# Patient Record
Sex: Male | Born: 1945 | Race: Black or African American | Hispanic: No | Marital: Single
Health system: Southern US, Community
[De-identification: ages and names within clinical notes are randomized; demographics above are authoritative.]

## PROBLEM LIST (undated history)

## (undated) DIAGNOSIS — J449 Chronic obstructive pulmonary disease, unspecified: Secondary | ICD-10-CM

## (undated) DIAGNOSIS — E039 Hypothyroidism, unspecified: Secondary | ICD-10-CM

---

## 2002-08-01 ENCOUNTER — Encounter: Payer: Self-pay | Admitting: General Surgery

## 2002-08-01 ENCOUNTER — Inpatient Hospital Stay (HOSPITAL_COMMUNITY): Admission: AC | Admit: 2002-08-01 | Discharge: 2002-08-19 | Payer: Self-pay

## 2002-08-01 ENCOUNTER — Encounter: Payer: Self-pay | Admitting: Emergency Medicine

## 2002-08-02 ENCOUNTER — Encounter: Payer: Self-pay | Admitting: General Surgery

## 2002-08-05 ENCOUNTER — Encounter: Payer: Self-pay | Admitting: General Surgery

## 2002-08-06 ENCOUNTER — Encounter: Payer: Self-pay | Admitting: Neurosurgery

## 2002-08-07 ENCOUNTER — Encounter (INDEPENDENT_AMBULATORY_CARE_PROVIDER_SITE_OTHER): Payer: Self-pay | Admitting: Cardiology

## 2002-08-07 ENCOUNTER — Encounter: Payer: Self-pay | Admitting: Neurosurgery

## 2002-08-08 ENCOUNTER — Encounter: Payer: Self-pay | Admitting: General Surgery

## 2002-08-09 ENCOUNTER — Encounter: Payer: Self-pay | Admitting: Neurosurgery

## 2002-08-12 ENCOUNTER — Encounter: Payer: Self-pay | Admitting: Orthopedic Surgery

## 2006-10-31 ENCOUNTER — Ambulatory Visit: Payer: Self-pay | Admitting: Cardiovascular Disease

## 2006-10-31 ENCOUNTER — Inpatient Hospital Stay (HOSPITAL_COMMUNITY): Admission: EM | Admit: 2006-10-31 | Discharge: 2006-11-03 | Payer: Self-pay | Admitting: Emergency Medicine

## 2006-11-01 ENCOUNTER — Encounter: Payer: Self-pay | Admitting: Cardiovascular Disease

## 2007-09-02 ENCOUNTER — Inpatient Hospital Stay (HOSPITAL_COMMUNITY): Admission: EM | Admit: 2007-09-02 | Discharge: 2007-09-05 | Payer: Self-pay | Admitting: Emergency Medicine

## 2007-09-04 ENCOUNTER — Encounter (INDEPENDENT_AMBULATORY_CARE_PROVIDER_SITE_OTHER): Payer: Self-pay | Admitting: Internal Medicine

## 2007-12-30 ENCOUNTER — Emergency Department (HOSPITAL_COMMUNITY): Admission: EM | Admit: 2007-12-30 | Discharge: 2007-12-30 | Payer: Self-pay | Admitting: Emergency Medicine

## 2008-01-25 ENCOUNTER — Emergency Department (HOSPITAL_COMMUNITY): Admission: EM | Admit: 2008-01-25 | Discharge: 2008-01-25 | Payer: Self-pay | Admitting: Emergency Medicine

## 2008-02-05 IMAGING — CT CT HEAD W/O CM
1 of 2 series · 13 of 30 positions shown, 17 images · IV contrast (agent unspecified)
Comparison: none

CLINICAL DATA: Syncope. 
 HEAD CT WITHOUT CONTRAST:
TECHNIQUE: Contiguous axial images were obtained from the base of the skull through the vertex according to standard protocol without contrast.

[Series 2: brain · axial · 0.47mm/px · z∈[+153,+279]mm · 13 of 28 slices shown, 17 images]
[im 2/28  brain]
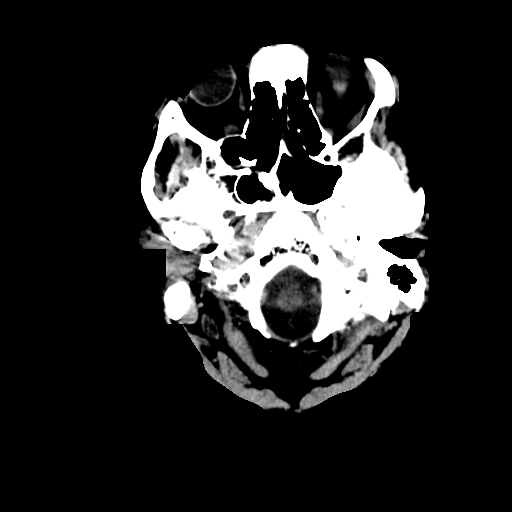
[im 2/28  bone]
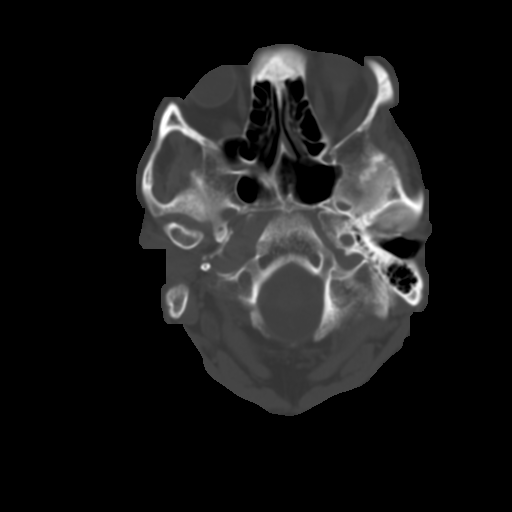
[im 4/28  brain]
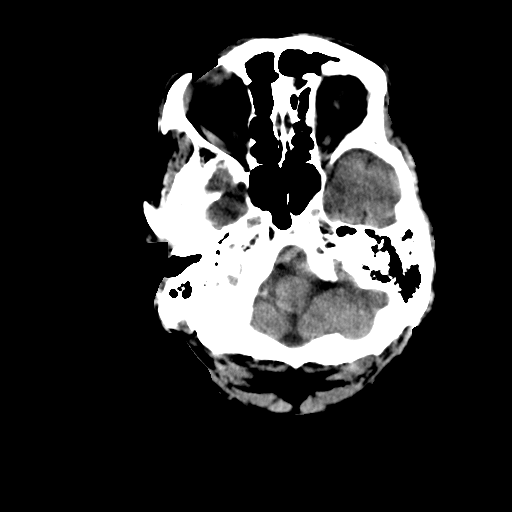
[im 6/28  brain]
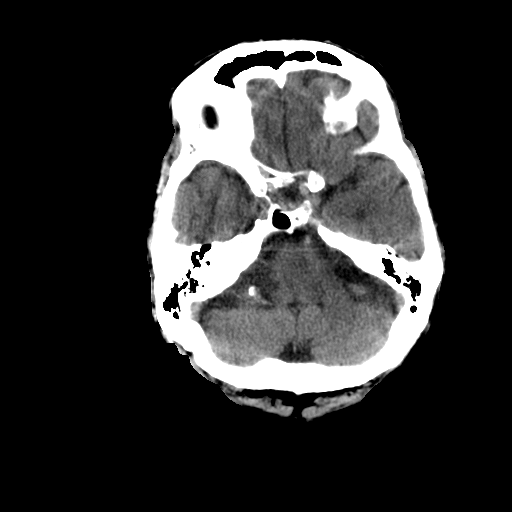
[im 8/28  brain]
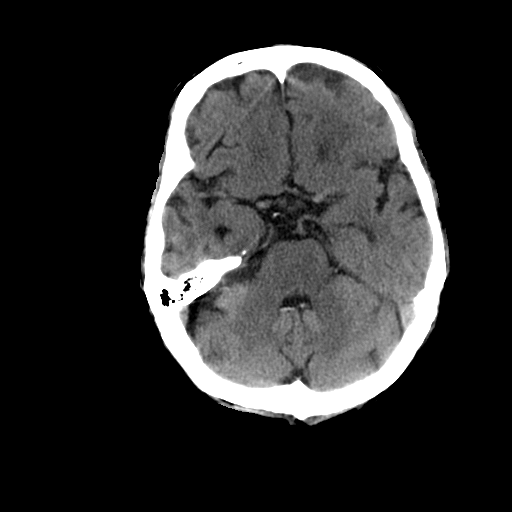
[im 10/28  brain]
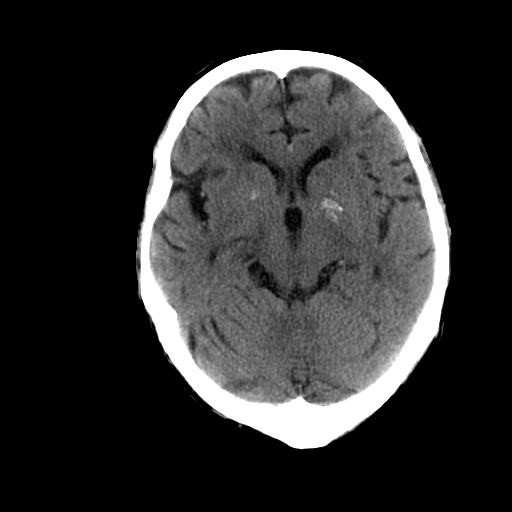
[im 10/28  bone]
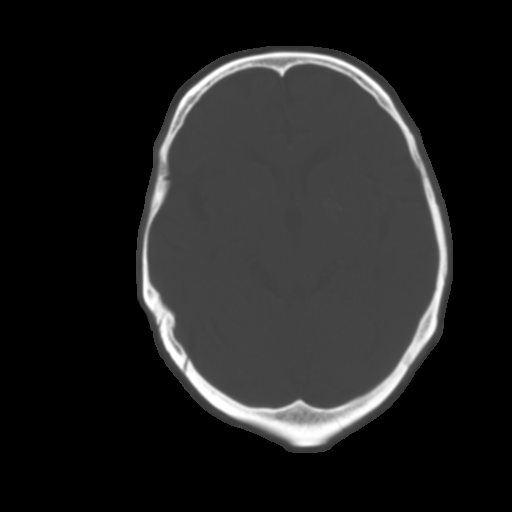
[im 12/28  brain]
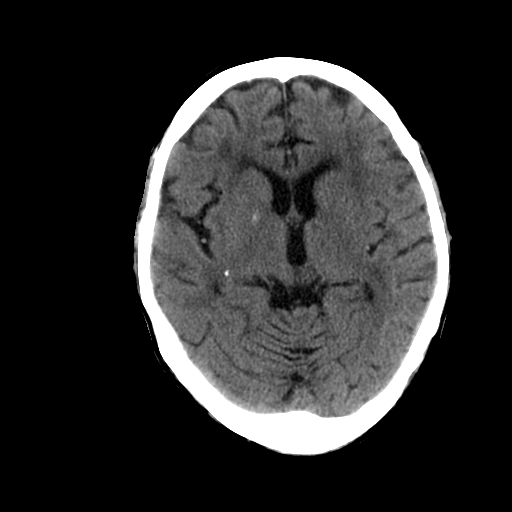
[im 14/28  brain]
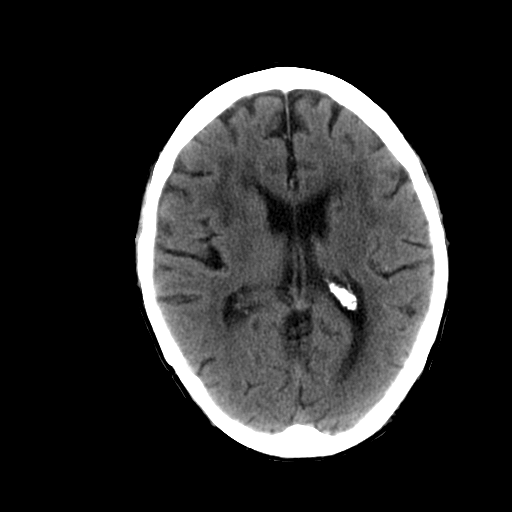
[im 16/28  brain]
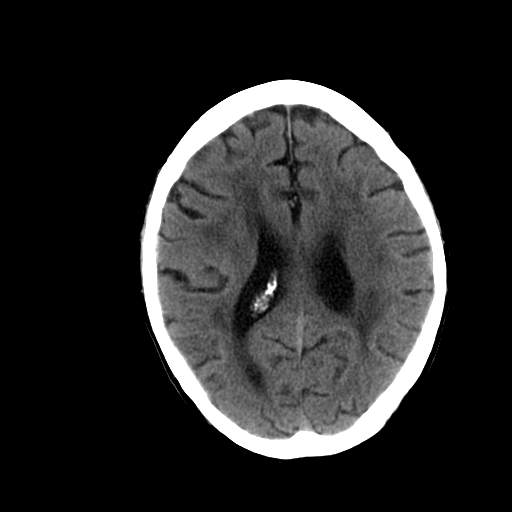
[im 18/28  brain]
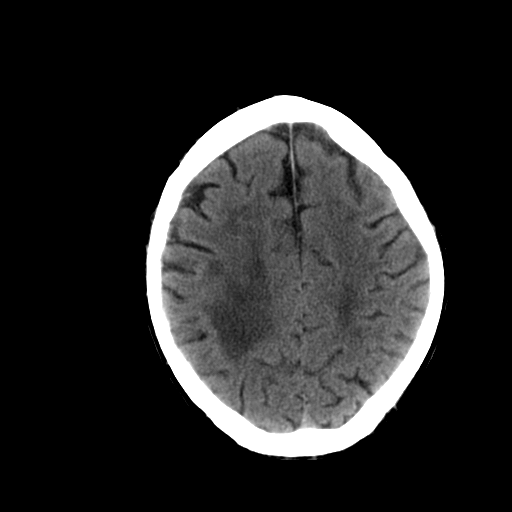
[im 18/28  bone]
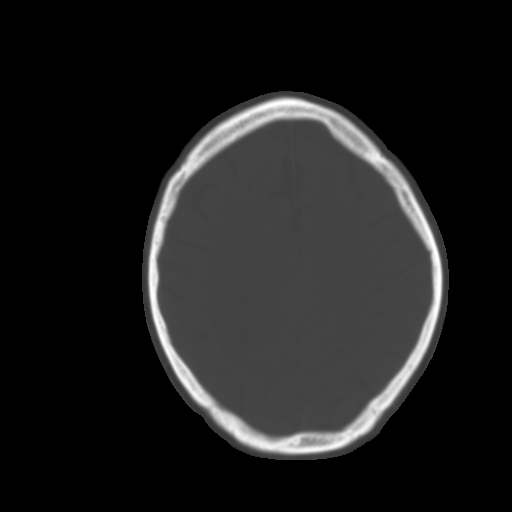
[im 20/28  brain]
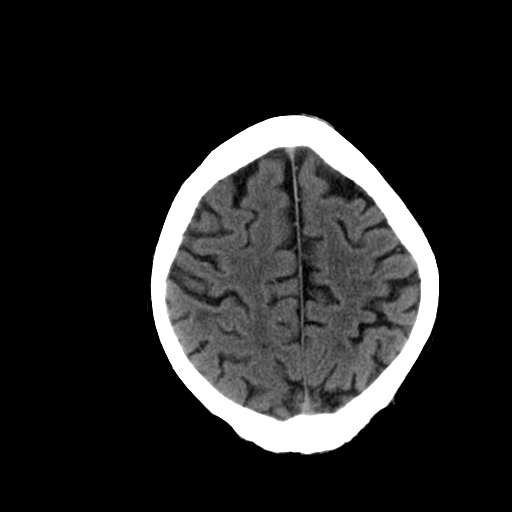
[im 22/28  brain]
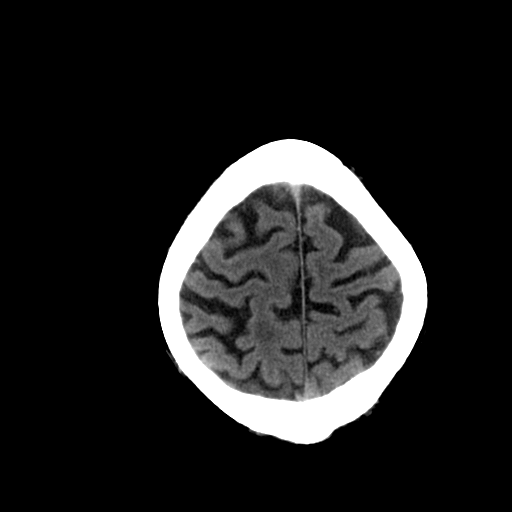
[im 24/28  brain]
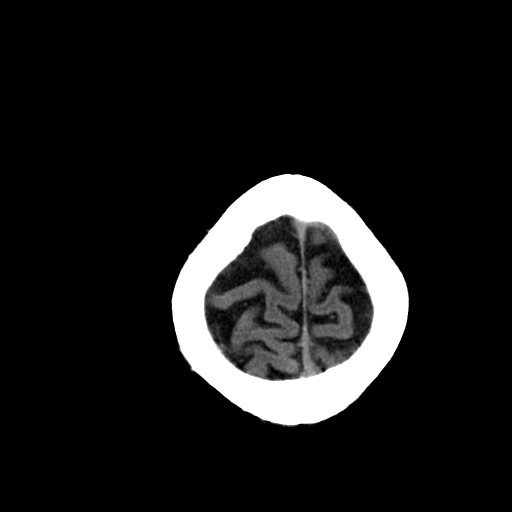
[im 26/28  brain]
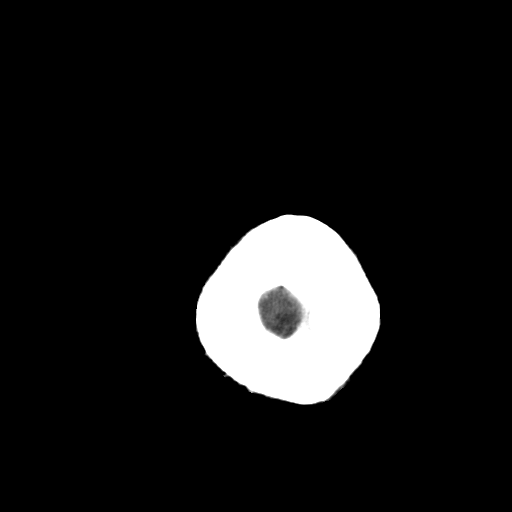
[im 26/28  bone]
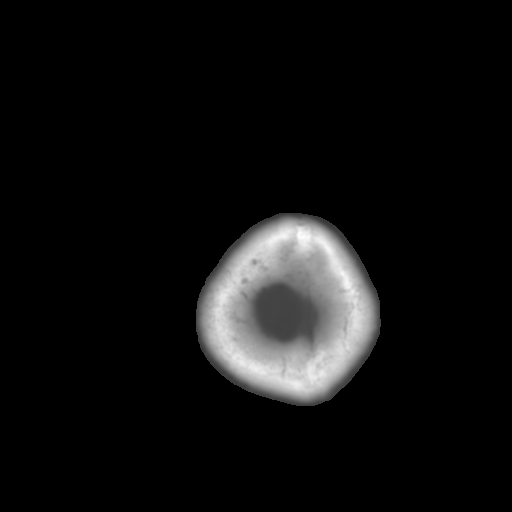

[13 of 30 positions shown; findings below may reference images not displayed]

FINDINGS: There is no evidence of intracranial hemorrhage, brain edema, or other signs of acute infarct. There is no evidence of intracranial mass or mass effect.  No extraaxial abnormalities are identified. Mild cerebral atrophy is noted. There is extensive chronic microvascular white matter disease seen. There is no evidence of hydrocephalus. No skull abnormality identified.
IMPRESSION: 1.   No acute intracranial findings. 
 2.  Cerebral atrophy and chronic microvascular white matter disease.

## 2008-04-27 ENCOUNTER — Emergency Department (HOSPITAL_COMMUNITY): Admission: EM | Admit: 2008-04-27 | Discharge: 2008-04-27 | Payer: Self-pay | Admitting: Family Medicine

## 2008-05-15 ENCOUNTER — Emergency Department (HOSPITAL_COMMUNITY): Admission: EM | Admit: 2008-05-15 | Discharge: 2008-05-16 | Payer: Self-pay | Admitting: Emergency Medicine

## 2008-06-04 ENCOUNTER — Emergency Department (HOSPITAL_COMMUNITY): Admission: EM | Admit: 2008-06-04 | Discharge: 2008-06-04 | Payer: Self-pay | Admitting: Emergency Medicine

## 2008-07-23 ENCOUNTER — Encounter: Admission: RE | Admit: 2008-07-23 | Discharge: 2008-07-23 | Payer: Self-pay | Admitting: Family Medicine

## 2009-06-05 ENCOUNTER — Ambulatory Visit: Payer: Self-pay | Admitting: Cardiovascular Disease

## 2009-06-05 ENCOUNTER — Inpatient Hospital Stay (HOSPITAL_COMMUNITY): Admission: EM | Admit: 2009-06-05 | Discharge: 2009-06-07 | Payer: Self-pay | Admitting: Emergency Medicine

## 2009-06-06 ENCOUNTER — Encounter (INDEPENDENT_AMBULATORY_CARE_PROVIDER_SITE_OTHER): Payer: Self-pay | Admitting: Emergency Medicine

## 2009-11-05 ENCOUNTER — Inpatient Hospital Stay (HOSPITAL_COMMUNITY): Admission: EM | Admit: 2009-11-05 | Discharge: 2009-12-02 | Payer: Self-pay | Admitting: Emergency Medicine

## 2009-11-05 ENCOUNTER — Ambulatory Visit: Payer: Self-pay | Admitting: Internal Medicine

## 2009-11-06 ENCOUNTER — Ambulatory Visit: Payer: Self-pay | Admitting: Vascular Surgery

## 2009-11-08 ENCOUNTER — Encounter (INDEPENDENT_AMBULATORY_CARE_PROVIDER_SITE_OTHER): Payer: Self-pay | Admitting: Pulmonary Disease

## 2009-11-16 ENCOUNTER — Ambulatory Visit: Payer: Self-pay | Admitting: Dentistry

## 2010-10-31 ENCOUNTER — Encounter: Payer: Self-pay | Admitting: Family Medicine

## 2010-12-26 LAB — HEPARIN LEVEL (UNFRACTIONATED)
Heparin Unfractionated: 0.12 IU/mL — ABNORMAL LOW (ref 0.30–0.70)
Heparin Unfractionated: 0.29 IU/mL — ABNORMAL LOW (ref 0.30–0.70)
Heparin Unfractionated: 0.64 IU/mL (ref 0.30–0.70)
Heparin Unfractionated: 0.9 IU/mL — ABNORMAL HIGH (ref 0.30–0.70)

## 2010-12-26 LAB — PHENYTOIN LEVEL, TOTAL
Phenytoin Lvl: 15.8 ug/mL (ref 10.0–20.0)
Phenytoin Lvl: 21.3 ug/mL — ABNORMAL HIGH (ref 10.0–20.0)
Phenytoin Lvl: <2.5 ug/mL — ABNORMAL LOW (ref 10.0–20.0)

## 2010-12-26 LAB — BLOOD GAS, ARTERIAL
Acid-Base Excess: 0.4 mmol/L (ref 0.0–2.0)
Acid-Base Excess: 3.9 mmol/L — ABNORMAL HIGH (ref 0.0–2.0)
Acid-Base Excess: 4.2 mmol/L — ABNORMAL HIGH (ref 0.0–2.0)
Acid-Base Excess: 4.4 mmol/L — ABNORMAL HIGH (ref 0.0–2.0)
Acid-base deficit: 2.5 mmol/L — ABNORMAL HIGH (ref 0.0–2.0)
Bicarbonate: 26.6 mEq/L — ABNORMAL HIGH (ref 20.0–24.0)
Bicarbonate: 27.3 mEq/L — ABNORMAL HIGH (ref 20.0–24.0)
Bicarbonate: 27.4 mEq/L — ABNORMAL HIGH (ref 20.0–24.0)
Bicarbonate: 27.8 mEq/L — ABNORMAL HIGH (ref 20.0–24.0)
Bicarbonate: 27.9 mEq/L — ABNORMAL HIGH (ref 20.0–24.0)
Bicarbonate: 28.3 mEq/L — ABNORMAL HIGH (ref 20.0–24.0)
Drawn by: 295031
Drawn by: 308601
FIO2: 0.3 %
FIO2: 0.4 %
FIO2: 0.4 %
FIO2: 0.4 %
MECHVT: 600 mL
MECHVT: 600 mL
O2 Saturation: 88.5 %
O2 Saturation: 95 %
O2 Saturation: 97.2 %
PEEP: 0 cmH2O
PEEP: 5 cmH2O
PEEP: 5 cmH2O
Patient temperature: 98.4
Patient temperature: 98.6
Patient temperature: 98.6
Patient temperature: 98.6
Patient temperature: 98.6
Pressure support: 10 cmH2O
RATE: 14 resp/min
RATE: 14 resp/min
RATE: 14 resp/min
TCO2: 24.6 mmol/L (ref 0–100)
TCO2: 24.8 mmol/L (ref 0–100)
TCO2: 25.5 mmol/L (ref 0–100)
TCO2: 25.8 mmol/L (ref 0–100)
TCO2: 27.5 mmol/L (ref 0–100)
pCO2 arterial: 36.7 mmHg (ref 35.0–45.0)
pCO2 arterial: 39.2 mmHg (ref 35.0–45.0)
pCO2 arterial: 39.5 mmHg (ref 35.0–45.0)
pCO2 arterial: 41.6 mmHg (ref 35.0–45.0)
pCO2 arterial: 51.7 mmHg — ABNORMAL HIGH (ref 35.0–45.0)
pCO2 arterial: 72.3 mmHg (ref 35.0–45.0)
pH, Arterial: 7.217 — ABNORMAL LOW (ref 7.350–7.450)
pH, Arterial: 7.286 — ABNORMAL LOW (ref 7.350–7.450)
pH, Arterial: 7.36 (ref 7.350–7.450)
pH, Arterial: 7.422 (ref 7.350–7.450)
pH, Arterial: 7.452 — ABNORMAL HIGH (ref 7.350–7.450)
pH, Arterial: 7.485 — ABNORMAL HIGH (ref 7.350–7.450)
pO2, Arterial: 58.7 mmHg — ABNORMAL LOW (ref 80.0–100.0)
pO2, Arterial: 62 mmHg — ABNORMAL LOW (ref 80.0–100.0)
pO2, Arterial: 71.5 mmHg — ABNORMAL LOW (ref 80.0–100.0)
pO2, Arterial: 87.5 mmHg (ref 80.0–100.0)

## 2010-12-26 LAB — BASIC METABOLIC PANEL
BUN: 20 mg/dL (ref 6–23)
BUN: 26 mg/dL — ABNORMAL HIGH (ref 6–23)
CO2: 27 mEq/L (ref 19–32)
Calcium: 6.9 mg/dL — ABNORMAL LOW (ref 8.4–10.5)
Calcium: 6.9 mg/dL — ABNORMAL LOW (ref 8.4–10.5)
Chloride: 105 mEq/L (ref 96–112)
Chloride: 108 mEq/L (ref 96–112)
Chloride: 109 mEq/L (ref 96–112)
Creatinine, Ser: 0.96 mg/dL (ref 0.4–1.5)
Creatinine, Ser: 1.77 mg/dL — ABNORMAL HIGH (ref 0.4–1.5)
GFR calc Af Amer: 47 mL/min — ABNORMAL LOW (ref >60)
GFR calc Af Amer: >60 mL/min (ref >60)
GFR calc non Af Amer: 39 mL/min — ABNORMAL LOW (ref >60)
Glucose, Bld: 101 mg/dL — ABNORMAL HIGH (ref 70–99)
Glucose, Bld: 87 mg/dL (ref 70–99)
Potassium: 3 mEq/L — ABNORMAL LOW (ref 3.5–5.1)
Potassium: 3.6 mEq/L (ref 3.5–5.1)
Sodium: 139 mEq/L (ref 135–145)

## 2010-12-26 LAB — URINE MICROSCOPIC-ADD ON

## 2010-12-26 LAB — CBC
HCT: 30.8 % — ABNORMAL LOW (ref 39.0–52.0)
HCT: 31.3 % — ABNORMAL LOW (ref 39.0–52.0)
HCT: 32.9 % — ABNORMAL LOW (ref 39.0–52.0)
HCT: 38.6 % — ABNORMAL LOW (ref 39.0–52.0)
Hemoglobin: 10.1 g/dL — ABNORMAL LOW (ref 13.0–17.0)
Hemoglobin: 10.4 g/dL — ABNORMAL LOW (ref 13.0–17.0)
Hemoglobin: 10.8 g/dL — ABNORMAL LOW (ref 13.0–17.0)
Hemoglobin: 13 g/dL (ref 13.0–17.0)
Hemoglobin: 9.9 g/dL — ABNORMAL LOW (ref 13.0–17.0)
MCHC: 31.2 g/dL (ref 30.0–36.0)
MCHC: 32.1 g/dL (ref 30.0–36.0)
MCHC: 32.2 g/dL (ref 30.0–36.0)
MCHC: 32.3 g/dL (ref 30.0–36.0)
MCHC: 32.3 g/dL (ref 30.0–36.0)
MCHC: 32.9 g/dL (ref 30.0–36.0)
MCHC: 34.1 g/dL (ref 30.0–36.0)
MCV: 100.6 fL — ABNORMAL HIGH (ref 78.0–100.0)
MCV: 99.2 fL (ref 78.0–100.0)
MCV: 99.5 fL (ref 78.0–100.0)
MCV: 99.6 fL (ref 78.0–100.0)
MCV: 99.7 fL (ref 78.0–100.0)
Platelets: 195 10*3/uL (ref 150–400)
Platelets: 200 10*3/uL (ref 150–400)
Platelets: 211 10*3/uL (ref 150–400)
Platelets: 227 10*3/uL (ref 150–400)
Platelets: 233 10*3/uL (ref 150–400)
Platelets: 268 10*3/uL (ref 150–400)
RBC: 3.1 MIL/uL — ABNORMAL LOW (ref 4.22–5.81)
RBC: 3.13 MIL/uL — ABNORMAL LOW (ref 4.22–5.81)
RBC: 3.24 MIL/uL — ABNORMAL LOW (ref 4.22–5.81)
RBC: 3.32 MIL/uL — ABNORMAL LOW (ref 4.22–5.81)
RBC: 3.41 MIL/uL — ABNORMAL LOW (ref 4.22–5.81)
RDW: 16.2 % — ABNORMAL HIGH (ref 11.5–15.5)
RDW: 16.8 % — ABNORMAL HIGH (ref 11.5–15.5)
RDW: 17 % — ABNORMAL HIGH (ref 11.5–15.5)
RDW: 17.1 % — ABNORMAL HIGH (ref 11.5–15.5)
RDW: 17.4 % — ABNORMAL HIGH (ref 11.5–15.5)
RDW: 17.6 % — ABNORMAL HIGH (ref 11.5–15.5)
WBC: 15.4 10*3/uL — ABNORMAL HIGH (ref 4.0–10.5)
WBC: 18 10*3/uL — ABNORMAL HIGH (ref 4.0–10.5)
WBC: 20 10*3/uL — ABNORMAL HIGH (ref 4.0–10.5)
WBC: 21.6 10*3/uL — ABNORMAL HIGH (ref 4.0–10.5)
WBC: 22.3 10*3/uL — ABNORMAL HIGH (ref 4.0–10.5)

## 2010-12-26 LAB — BRAIN NATRIURETIC PEPTIDE
Pro B Natriuretic peptide (BNP): 588 pg/mL — ABNORMAL HIGH (ref 0.0–100.0)
Pro B Natriuretic peptide (BNP): 83.5 pg/mL (ref 0.0–100.0)
Pro B Natriuretic peptide (BNP): 980 pg/mL — ABNORMAL HIGH (ref 0.0–100.0)

## 2010-12-26 LAB — CORTISOL: Cortisol, Plasma: 20.8 ug/dL

## 2010-12-26 LAB — COMPREHENSIVE METABOLIC PANEL
AST: 1248 U/L — ABNORMAL HIGH (ref 0–37)
AST: 159 U/L — ABNORMAL HIGH (ref 0–37)
Albumin: 1.1 g/dL — CL (ref 3.5–5.2)
Albumin: 1.6 g/dL — ABNORMAL LOW (ref 3.5–5.2)
Albumin: 2.2 g/dL — ABNORMAL LOW (ref 3.5–5.2)
Alkaline Phosphatase: 49 U/L (ref 39–117)
Alkaline Phosphatase: 85 U/L (ref 39–117)
BUN: 32 mg/dL — ABNORMAL HIGH (ref 6–23)
CO2: 28 mEq/L (ref 19–32)
CO2: 30 mEq/L (ref 19–32)
Calcium: 6.3 mg/dL — CL (ref 8.4–10.5)
Calcium: 7.5 mg/dL — ABNORMAL LOW (ref 8.4–10.5)
Chloride: 105 mEq/L (ref 96–112)
Chloride: 95 mEq/L — ABNORMAL LOW (ref 96–112)
Creatinine, Ser: 2.6 mg/dL — ABNORMAL HIGH (ref 0.4–1.5)
Creatinine, Ser: 3.09 mg/dL — ABNORMAL HIGH (ref 0.4–1.5)
GFR calc Af Amer: 30 mL/min — ABNORMAL LOW (ref >60)
GFR calc Af Amer: >60 mL/min (ref >60)
GFR calc non Af Amer: 20 mL/min — ABNORMAL LOW (ref >60)
GFR calc non Af Amer: >60 mL/min (ref >60)
Potassium: 3.8 mEq/L (ref 3.5–5.1)
Total Bilirubin: 0.9 mg/dL (ref 0.3–1.2)
Total Protein: 4.9 g/dL — ABNORMAL LOW (ref 6.0–8.3)
Total Protein: 6.5 g/dL (ref 6.0–8.3)

## 2010-12-26 LAB — APTT
aPTT: 40 seconds — ABNORMAL HIGH (ref 24–37)
aPTT: 41 seconds — ABNORMAL HIGH (ref 24–37)

## 2010-12-26 LAB — PROTIME-INR
INR: 1.18 (ref 0.00–1.49)
INR: 1.8 — ABNORMAL HIGH (ref 0.00–1.49)
Prothrombin Time: 20.7 seconds — ABNORMAL HIGH (ref 11.6–15.2)

## 2010-12-26 LAB — URINE CULTURE: Culture: NO GROWTH

## 2010-12-26 LAB — CARDIAC PANEL(CRET KIN+CKTOT+MB+TROPI)
CK, MB: 1.5 ng/mL (ref 0.3–4.0)
Troponin I: 0.03 ng/mL (ref 0.00–0.06)
Troponin I: 0.06 ng/mL (ref 0.00–0.06)

## 2010-12-26 LAB — DIFFERENTIAL
Basophils Absolute: 0.3 10*3/uL — ABNORMAL HIGH (ref 0.0–0.1)
Basophils Relative: 1 % (ref 0–1)
Eosinophils Absolute: 0 10*3/uL (ref 0.0–0.7)
Lymphocytes Relative: 3 % — ABNORMAL LOW (ref 12–46)
Monocytes Relative: 3 % (ref 3–12)

## 2010-12-26 LAB — CARBOXYHEMOGLOBIN
Carboxyhemoglobin: 2 % — ABNORMAL HIGH (ref 0.5–1.5)
Methemoglobin: 2.8 % — ABNORMAL HIGH (ref 0.0–1.5)
O2 Saturation: 65.7 %
Total hemoglobin: 10.3 g/dL — ABNORMAL LOW (ref 13.5–18.0)

## 2010-12-26 LAB — LEGIONELLA ANTIGEN, URINE

## 2010-12-26 LAB — HEPATIC FUNCTION PANEL
Bilirubin, Direct: 0.2 mg/dL (ref 0.0–0.3)
Indirect Bilirubin: 0.8 mg/dL (ref 0.3–0.9)
Total Protein: 4.4 g/dL — ABNORMAL LOW (ref 6.0–8.3)

## 2010-12-26 LAB — CULTURE, BAL-QUANTITATIVE W GRAM STAIN

## 2010-12-26 LAB — LACTIC ACID, PLASMA: Lactic Acid, Venous: 5 mmol/L — ABNORMAL HIGH (ref 0.5–2.2)

## 2010-12-26 LAB — STREP PNEUMONIAE URINARY ANTIGEN: Strep Pneumo Urinary Antigen: POSITIVE — AB

## 2010-12-26 LAB — AMYLASE: Amylase: 29 U/L (ref 0–105)

## 2010-12-26 LAB — DIC (DISSEMINATED INTRAVASCULAR COAGULATION)PANEL
Fibrinogen: 311 mg/dL (ref 204–475)
INR: 1.63 — ABNORMAL HIGH (ref 0.00–1.49)
Platelets: 239 10*3/uL (ref 150–400)
Prothrombin Time: 19.2 seconds — ABNORMAL HIGH (ref 11.6–15.2)
Smear Review: NONE SEEN

## 2010-12-26 LAB — LIPASE, BLOOD: Lipase: 15 U/L (ref 11–59)

## 2010-12-26 LAB — URINALYSIS, ROUTINE W REFLEX MICROSCOPIC
Specific Gravity, Urine: 1.026 (ref 1.005–1.030)
pH: 5 (ref 5.0–8.0)

## 2010-12-26 LAB — CK TOTAL AND CKMB (NOT AT ARMC)
Relative Index: INVALID (ref 0.0–2.5)
Total CK: 28 U/L (ref 7–232)

## 2010-12-26 LAB — CULTURE, BLOOD (ROUTINE X 2)

## 2010-12-26 LAB — BODY FLUID CULTURE

## 2010-12-26 LAB — GLUCOSE, SEROUS FLUID: Glucose, Fluid: 119 mg/dL

## 2010-12-26 LAB — MRSA PCR SCREENING: MRSA by PCR: NEGATIVE

## 2010-12-26 LAB — DIGOXIN LEVEL: Digoxin Level: 2.9 ng/mL (ref 0.8–2.0)

## 2010-12-29 LAB — CBC
HCT: 29.3 % — ABNORMAL LOW (ref 39.0–52.0)
HCT: 29.5 % — ABNORMAL LOW (ref 39.0–52.0)
HCT: 30 % — ABNORMAL LOW (ref 39.0–52.0)
HCT: 30.3 % — ABNORMAL LOW (ref 39.0–52.0)
HCT: 30.6 % — ABNORMAL LOW (ref 39.0–52.0)
HCT: 31.4 % — ABNORMAL LOW (ref 39.0–52.0)
HCT: 31.6 % — ABNORMAL LOW (ref 39.0–52.0)
HCT: 31.7 % — ABNORMAL LOW (ref 39.0–52.0)
HCT: 33.2 % — ABNORMAL LOW (ref 39.0–52.0)
HCT: 34.6 % — ABNORMAL LOW (ref 39.0–52.0)
HCT: 36.9 % — ABNORMAL LOW (ref 39.0–52.0)
Hemoglobin: 10.3 g/dL — ABNORMAL LOW (ref 13.0–17.0)
Hemoglobin: 10.7 g/dL — ABNORMAL LOW (ref 13.0–17.0)
Hemoglobin: 10.9 g/dL — ABNORMAL LOW (ref 13.0–17.0)
Hemoglobin: 10.9 g/dL — ABNORMAL LOW (ref 13.0–17.0)
Hemoglobin: 12.1 g/dL — ABNORMAL LOW (ref 13.0–17.0)
Hemoglobin: 12.4 g/dL — ABNORMAL LOW (ref 13.0–17.0)
Hemoglobin: 9.4 g/dL — ABNORMAL LOW (ref 13.0–17.0)
Hemoglobin: 9.6 g/dL — ABNORMAL LOW (ref 13.0–17.0)
Hemoglobin: 9.9 g/dL — ABNORMAL LOW (ref 13.0–17.0)
MCHC: 32.2 g/dL (ref 30.0–36.0)
MCHC: 32.5 g/dL (ref 30.0–36.0)
MCHC: 32.6 g/dL (ref 30.0–36.0)
MCHC: 32.6 g/dL (ref 30.0–36.0)
MCHC: 33.1 g/dL (ref 30.0–36.0)
MCHC: 34 g/dL (ref 30.0–36.0)
MCV: 100.3 fL — ABNORMAL HIGH (ref 78.0–100.0)
MCV: 100.3 fL — ABNORMAL HIGH (ref 78.0–100.0)
MCV: 100.6 fL — ABNORMAL HIGH (ref 78.0–100.0)
MCV: 100.7 fL — ABNORMAL HIGH (ref 78.0–100.0)
MCV: 100.9 fL — ABNORMAL HIGH (ref 78.0–100.0)
MCV: 101.1 fL — ABNORMAL HIGH (ref 78.0–100.0)
MCV: 101.2 fL — ABNORMAL HIGH (ref 78.0–100.0)
MCV: 97 fL (ref 78.0–100.0)
MCV: 98.9 fL (ref 78.0–100.0)
MCV: 99.4 fL (ref 78.0–100.0)
MCV: 99.4 fL (ref 78.0–100.0)
Platelets: 194 10*3/uL (ref 150–400)
Platelets: 196 10*3/uL (ref 150–400)
Platelets: 200 10*3/uL (ref 150–400)
Platelets: 209 10*3/uL (ref 150–400)
Platelets: 210 10*3/uL (ref 150–400)
Platelets: 219 10*3/uL (ref 150–400)
Platelets: 228 10*3/uL (ref 150–400)
RBC: 3.07 MIL/uL — ABNORMAL LOW (ref 4.22–5.81)
RBC: 3.14 MIL/uL — ABNORMAL LOW (ref 4.22–5.81)
RBC: 3.18 MIL/uL — ABNORMAL LOW (ref 4.22–5.81)
RBC: 3.19 MIL/uL — ABNORMAL LOW (ref 4.22–5.81)
RBC: 3.25 MIL/uL — ABNORMAL LOW (ref 4.22–5.81)
RBC: 3.6 MIL/uL — ABNORMAL LOW (ref 4.22–5.81)
RBC: 3.72 MIL/uL — ABNORMAL LOW (ref 4.22–5.81)
RBC: 3.84 MIL/uL — ABNORMAL LOW (ref 4.22–5.81)
RDW: 15 % (ref 11.5–15.5)
RDW: 16 % — ABNORMAL HIGH (ref 11.5–15.5)
RDW: 16.4 % — ABNORMAL HIGH (ref 11.5–15.5)
RDW: 16.6 % — ABNORMAL HIGH (ref 11.5–15.5)
RDW: 16.8 % — ABNORMAL HIGH (ref 11.5–15.5)
RDW: 16.9 % — ABNORMAL HIGH (ref 11.5–15.5)
RDW: 17 % — ABNORMAL HIGH (ref 11.5–15.5)
RDW: 17.5 % — ABNORMAL HIGH (ref 11.5–15.5)
WBC: 10.5 10*3/uL (ref 4.0–10.5)
WBC: 11.3 10*3/uL — ABNORMAL HIGH (ref 4.0–10.5)
WBC: 11.9 10*3/uL — ABNORMAL HIGH (ref 4.0–10.5)
WBC: 12.8 10*3/uL — ABNORMAL HIGH (ref 4.0–10.5)
WBC: 5.8 10*3/uL (ref 4.0–10.5)
WBC: 6.1 10*3/uL (ref 4.0–10.5)
WBC: 6.2 10*3/uL (ref 4.0–10.5)
WBC: 6.8 10*3/uL (ref 4.0–10.5)

## 2010-12-29 LAB — CARDIAC PANEL(CRET KIN+CKTOT+MB+TROPI)
CK, MB: 0.6 ng/mL (ref 0.3–4.0)
Relative Index: INVALID (ref 0.0–2.5)
Relative Index: INVALID (ref 0.0–2.5)
Total CK: 32 U/L (ref 7–232)
Total CK: 36 U/L (ref 7–232)

## 2010-12-29 LAB — BASIC METABOLIC PANEL
BUN: 13 mg/dL (ref 6–23)
BUN: 4 mg/dL — ABNORMAL LOW (ref 6–23)
BUN: 8 mg/dL (ref 6–23)
CO2: 31 mEq/L (ref 19–32)
CO2: 34 mEq/L — ABNORMAL HIGH (ref 19–32)
CO2: 35 mEq/L — ABNORMAL HIGH (ref 19–32)
Calcium: 7.5 mg/dL — ABNORMAL LOW (ref 8.4–10.5)
Calcium: 8.1 mg/dL — ABNORMAL LOW (ref 8.4–10.5)
Calcium: 8.5 mg/dL (ref 8.4–10.5)
Calcium: 8.5 mg/dL (ref 8.4–10.5)
Chloride: 103 mEq/L (ref 96–112)
Chloride: 104 mEq/L (ref 96–112)
Chloride: 106 mEq/L (ref 96–112)
Chloride: 95 mEq/L — ABNORMAL LOW (ref 96–112)
Creatinine, Ser: 0.69 mg/dL (ref 0.4–1.5)
Creatinine, Ser: 0.76 mg/dL (ref 0.4–1.5)
Creatinine, Ser: 0.85 mg/dL (ref 0.4–1.5)
Creatinine, Ser: 0.86 mg/dL (ref 0.4–1.5)
GFR calc Af Amer: >60 mL/min (ref >60)
GFR calc Af Amer: >60 mL/min (ref >60)
GFR calc Af Amer: >60 mL/min (ref >60)
GFR calc Af Amer: >60 mL/min (ref >60)
GFR calc non Af Amer: >60 mL/min (ref >60)
GFR calc non Af Amer: >60 mL/min (ref >60)
GFR calc non Af Amer: >60 mL/min (ref >60)
Glucose, Bld: 105 mg/dL — ABNORMAL HIGH (ref 70–99)
Glucose, Bld: 89 mg/dL (ref 70–99)
Glucose, Bld: 91 mg/dL (ref 70–99)
Glucose, Bld: 96 mg/dL (ref 70–99)
Glucose, Bld: 99 mg/dL (ref 70–99)
Potassium: 4.1 mEq/L (ref 3.5–5.1)
Potassium: 4.6 mEq/L (ref 3.5–5.1)
Sodium: 139 mEq/L (ref 135–145)
Sodium: 140 mEq/L (ref 135–145)
Sodium: 140 mEq/L (ref 135–145)

## 2010-12-29 LAB — GLUCOSE, CAPILLARY
Glucose-Capillary: 101 mg/dL — ABNORMAL HIGH (ref 70–99)
Glucose-Capillary: 103 mg/dL — ABNORMAL HIGH (ref 70–99)
Glucose-Capillary: 73 mg/dL (ref 70–99)
Glucose-Capillary: 81 mg/dL (ref 70–99)
Glucose-Capillary: 90 mg/dL (ref 70–99)
Glucose-Capillary: 94 mg/dL (ref 70–99)
Glucose-Capillary: 94 mg/dL (ref 70–99)
Glucose-Capillary: 95 mg/dL (ref 70–99)
Glucose-Capillary: 95 mg/dL (ref 70–99)

## 2010-12-29 LAB — COMPREHENSIVE METABOLIC PANEL
ALT: 119 U/L — ABNORMAL HIGH (ref 0–53)
ALT: 155 U/L — ABNORMAL HIGH (ref 0–53)
ALT: 65 U/L — ABNORMAL HIGH (ref 0–53)
AST: 19 U/L (ref 0–37)
AST: 30 U/L (ref 0–37)
AST: 38 U/L — ABNORMAL HIGH (ref 0–37)
AST: 41 U/L — ABNORMAL HIGH (ref 0–37)
AST: 49 U/L — ABNORMAL HIGH (ref 0–37)
Albumin: 1.6 g/dL — ABNORMAL LOW (ref 3.5–5.2)
Albumin: 1.6 g/dL — ABNORMAL LOW (ref 3.5–5.2)
Albumin: 1.8 g/dL — ABNORMAL LOW (ref 3.5–5.2)
Albumin: 1.9 g/dL — ABNORMAL LOW (ref 3.5–5.2)
Albumin: 2.1 g/dL — ABNORMAL LOW (ref 3.5–5.2)
Albumin: 2.4 g/dL — ABNORMAL LOW (ref 3.5–5.2)
Alkaline Phosphatase: 108 U/L (ref 39–117)
Alkaline Phosphatase: 76 U/L (ref 39–117)
BUN: 12 mg/dL (ref 6–23)
BUN: 17 mg/dL (ref 6–23)
BUN: 7 mg/dL (ref 6–23)
CO2: 29 mEq/L (ref 19–32)
CO2: 36 mEq/L — ABNORMAL HIGH (ref 19–32)
CO2: 39 mEq/L — ABNORMAL HIGH (ref 19–32)
Calcium: 7.4 mg/dL — ABNORMAL LOW (ref 8.4–10.5)
Calcium: 7.5 mg/dL — ABNORMAL LOW (ref 8.4–10.5)
Calcium: 7.9 mg/dL — ABNORMAL LOW (ref 8.4–10.5)
Calcium: 8.2 mg/dL — ABNORMAL LOW (ref 8.4–10.5)
Calcium: 8.5 mg/dL (ref 8.4–10.5)
Chloride: 100 mEq/L (ref 96–112)
Chloride: 110 mEq/L (ref 96–112)
Chloride: 95 mEq/L — ABNORMAL LOW (ref 96–112)
Chloride: 98 mEq/L (ref 96–112)
Chloride: 98 mEq/L (ref 96–112)
Creatinine, Ser: 0.71 mg/dL (ref 0.4–1.5)
Creatinine, Ser: 0.77 mg/dL (ref 0.4–1.5)
Creatinine, Ser: 0.81 mg/dL (ref 0.4–1.5)
Creatinine, Ser: 0.88 mg/dL (ref 0.4–1.5)
Creatinine, Ser: 0.98 mg/dL (ref 0.4–1.5)
GFR calc Af Amer: >60 mL/min (ref >60)
GFR calc Af Amer: >60 mL/min (ref >60)
GFR calc Af Amer: >60 mL/min (ref >60)
GFR calc Af Amer: >60 mL/min (ref >60)
GFR calc Af Amer: >60 mL/min (ref >60)
GFR calc Af Amer: >60 mL/min (ref >60)
GFR calc Af Amer: >60 mL/min (ref >60)
GFR calc non Af Amer: >60 mL/min (ref >60)
GFR calc non Af Amer: >60 mL/min (ref >60)
GFR calc non Af Amer: >60 mL/min (ref >60)
Glucose, Bld: 96 mg/dL (ref 70–99)
Potassium: 3.2 mEq/L — ABNORMAL LOW (ref 3.5–5.1)
Potassium: 3.8 mEq/L (ref 3.5–5.1)
Sodium: 139 mEq/L (ref 135–145)
Sodium: 140 mEq/L (ref 135–145)
Sodium: 142 mEq/L (ref 135–145)
Sodium: 142 mEq/L (ref 135–145)
Total Bilirubin: 0.3 mg/dL (ref 0.3–1.2)
Total Bilirubin: 0.4 mg/dL (ref 0.3–1.2)
Total Bilirubin: 0.5 mg/dL (ref 0.3–1.2)
Total Bilirubin: 1.1 mg/dL (ref 0.3–1.2)
Total Protein: 4.9 g/dL — ABNORMAL LOW (ref 6.0–8.3)
Total Protein: 6 g/dL (ref 6.0–8.3)

## 2010-12-29 LAB — BLOOD GAS, ARTERIAL
Acid-Base Excess: 12.8 mmol/L — ABNORMAL HIGH (ref 0.0–2.0)
Bicarbonate: 35.8 mEq/L — ABNORMAL HIGH (ref 20.0–24.0)
Bicarbonate: 38.4 mEq/L — ABNORMAL HIGH (ref 20.0–24.0)
Drawn by: 308601
FIO2: 0.21 %
O2 Saturation: 89.4 %
Patient temperature: 98.6
TCO2: 35.5 mmol/L (ref 0–100)
pCO2 arterial: 57.5 mmHg (ref 35.0–45.0)
pH, Arterial: 7.411 (ref 7.350–7.450)
pO2, Arterial: 63.3 mmHg — ABNORMAL LOW (ref 80.0–100.0)

## 2010-12-29 LAB — HEPATITIS PANEL, ACUTE
Hep A IgM: NEGATIVE
Hep B C IgM: NEGATIVE

## 2010-12-29 LAB — PROTIME-INR
INR: 0.97 (ref 0.00–1.49)
INR: 1.02 (ref 0.00–1.49)
INR: 1.06 (ref 0.00–1.49)
INR: 1.09 (ref 0.00–1.49)
Prothrombin Time: 12.8 seconds (ref 11.6–15.2)
Prothrombin Time: 14 seconds (ref 11.6–15.2)

## 2010-12-29 LAB — MAGNESIUM: Magnesium: 1.9 mg/dL (ref 1.5–2.5)

## 2010-12-29 LAB — APTT: aPTT: 49 seconds — ABNORMAL HIGH (ref 24–37)

## 2010-12-29 LAB — ALBUMIN: Albumin: 2.1 g/dL — ABNORMAL LOW (ref 3.5–5.2)

## 2010-12-29 LAB — HEPARIN LEVEL (UNFRACTIONATED): Heparin Unfractionated: 0.7 IU/mL (ref 0.30–0.70)

## 2011-01-15 LAB — URINALYSIS, ROUTINE W REFLEX MICROSCOPIC
Hgb urine dipstick: NEGATIVE
Nitrite: NEGATIVE
Specific Gravity, Urine: 1.013 (ref 1.005–1.030)
Urobilinogen, UA: 0.2 mg/dL (ref 0.0–1.0)
pH: 6.5 (ref 5.0–8.0)

## 2011-01-15 LAB — TROPONIN I: Troponin I: 0.03 ng/mL (ref 0.00–0.06)

## 2011-01-15 LAB — URINE MICROSCOPIC-ADD ON

## 2011-01-15 LAB — CBC
HCT: 37.7 % — ABNORMAL LOW (ref 39.0–52.0)
HCT: 38.5 % — ABNORMAL LOW (ref 39.0–52.0)
Hemoglobin: 12.9 g/dL — ABNORMAL LOW (ref 13.0–17.0)
MCHC: 33.3 g/dL (ref 30.0–36.0)
MCV: 99.5 fL (ref 78.0–100.0)
MCV: 99.6 fL (ref 78.0–100.0)
Platelets: 122 10*3/uL — ABNORMAL LOW (ref 150–400)
RBC: 3.79 MIL/uL — ABNORMAL LOW (ref 4.22–5.81)
RBC: 3.87 MIL/uL — ABNORMAL LOW (ref 4.22–5.81)
RBC: 4.04 MIL/uL — ABNORMAL LOW (ref 4.22–5.81)
RDW: 13 % (ref 11.5–15.5)
WBC: 7.3 10*3/uL (ref 4.0–10.5)
WBC: 7.3 10*3/uL (ref 4.0–10.5)

## 2011-01-15 LAB — DIFFERENTIAL
Lymphocytes Relative: 9 % — ABNORMAL LOW (ref 12–46)
Lymphs Abs: 0.6 10*3/uL — ABNORMAL LOW (ref 0.7–4.0)
Monocytes Relative: 4 % (ref 3–12)
Neutro Abs: 6.4 10*3/uL (ref 1.7–7.7)
Neutrophils Relative %: 87 % — ABNORMAL HIGH (ref 43–77)

## 2011-01-15 LAB — RAPID URINE DRUG SCREEN, HOSP PERFORMED
Amphetamines: NOT DETECTED
Benzodiazepines: NOT DETECTED

## 2011-01-15 LAB — BASIC METABOLIC PANEL
Calcium: 8.9 mg/dL (ref 8.4–10.5)
Creatinine, Ser: 0.98 mg/dL (ref 0.4–1.5)
GFR calc Af Amer: >60 mL/min (ref >60)

## 2011-01-15 LAB — COMPREHENSIVE METABOLIC PANEL
AST: 20 U/L (ref 0–37)
Albumin: 3 g/dL — ABNORMAL LOW (ref 3.5–5.2)
Albumin: 3.3 g/dL — ABNORMAL LOW (ref 3.5–5.2)
Alkaline Phosphatase: 57 U/L (ref 39–117)
Alkaline Phosphatase: 61 U/L (ref 39–117)
BUN: 6 mg/dL (ref 6–23)
BUN: 8 mg/dL (ref 6–23)
CO2: 35 mEq/L — ABNORMAL HIGH (ref 19–32)
Calcium: 7.9 mg/dL — ABNORMAL LOW (ref 8.4–10.5)
Chloride: 103 mEq/L (ref 96–112)
Creatinine, Ser: 0.76 mg/dL (ref 0.4–1.5)
Creatinine, Ser: 0.92 mg/dL (ref 0.4–1.5)
GFR calc non Af Amer: >60 mL/min (ref >60)
Potassium: 3.5 mEq/L (ref 3.5–5.1)
Potassium: 3.8 mEq/L (ref 3.5–5.1)
Total Bilirubin: 0.8 mg/dL (ref 0.3–1.2)
Total Protein: 5.4 g/dL — ABNORMAL LOW (ref 6.0–8.3)

## 2011-01-15 LAB — ETHANOL: Alcohol, Ethyl (B): <5 mg/dL (ref 0–10)

## 2011-01-15 LAB — HEMOGLOBIN A1C
Hgb A1c MFr Bld: 5.1 % (ref 4.6–6.1)
Mean Plasma Glucose: 100 mg/dL

## 2011-01-15 LAB — PROTIME-INR
INR: 0.9 (ref 0.00–1.49)
Prothrombin Time: 12.4 seconds (ref 11.6–15.2)

## 2011-01-15 LAB — GLUCOSE, CAPILLARY
Glucose-Capillary: 192 mg/dL — ABNORMAL HIGH (ref 70–99)
Glucose-Capillary: 85 mg/dL (ref 70–99)
Glucose-Capillary: 96 mg/dL (ref 70–99)
Glucose-Capillary: 99 mg/dL (ref 70–99)

## 2011-01-15 LAB — MAGNESIUM: Magnesium: 1.6 mg/dL (ref 1.5–2.5)

## 2011-01-15 LAB — CK TOTAL AND CKMB (NOT AT ARMC)
CK, MB: 1.4 ng/mL (ref 0.3–4.0)
Relative Index: 0.9 (ref 0.0–2.5)
Total CK: 153 U/L (ref 7–232)

## 2011-01-15 LAB — POCT CARDIAC MARKERS: Troponin i, poc: <0.05 ng/mL (ref 0.00–0.09)

## 2011-02-22 NOTE — Discharge Summary (Signed)
NAME:  NYKO, GELL NO.:  [id number]   MEDICAL RECORD NO.:  [id number]          PATIENT TYPE:  INP   LOCATION:  3730                         FACILITY:  [facility]   PHYSICIAN:  Cassell Clement, M.D. DATE OF BIRTH:  12/07/1943   DATE OF ADMISSION:  06/05/2009  DATE OF DISCHARGE:  06/07/2009                               DISCHARGE SUMMARY   FINAL DIAGNOSES:  1. Paroxysmal atrial fibrillation, resolved.  2. Seizure disorder.  3. Chronic obstructive pulmonary disease.  4. Alcohol abuse.  5. Mild left ventricular systolic dysfunction with diastolic      dysfunction.   OPERATIONS PERFORMED:  A 2-D echocardiogram.   HISTORY:  This is a 65 year old African American gentleman who was  admitted through the emergency room unassigned.  He had presented to the  emergency room after having a seizure which was witnessed at the grocery  store.  He has a past history of seizures for the past 1-2 years.  He  has a history of drinking heavily, and he is noncompliant with his  Dilantin.  On the day of admission, he had exerted himself in the heat  and then in the grocery store had a witnessed seizure.  He was brought  to the emergency room and was being loaded with Dilantin when he went  into rapid atrial fibrillation and therefore was admitted to cardiology.  He was unaware of his heart racing or of palpitations.  The patient  smokes a pack a day.  He is illiterate.  He drinks alcohol heavily.   He denies any history of coronary artery disease or heart attack or  symptoms of congestive heart failure.  His chest x-ray on admission  showed no acute cardiopulmonary process, but he does have some chronic  right posterior rib fractures.  His serum Dilantin level on admission  was less than 2.5, hemoglobin was 13.5, hematocrit 40, white count 7300.  His blood sugar was 213 on admission.   HOSPITAL COURSE:  The patient was admitted, was placed on a IV diltiazem  drip and was placed  on Lovenox.  By the following day, his atrial  fibrillation had converted to normal sinus rhythm.  His IV diltiazem had  been stopped, and at that point he was placed on a small dose of  metoprolol ER 12.5 mg one daily.  The patient insisted he has been  taking his Dilantin 3 times a day, morning, noon, and night; however,  his serum Dilantin was less than 2.5 and is consistent with  noncompliance.  His vital signs remained stable.  His followup serum  potassium was borderline low at 3.5, so we added potassium to his  medication leads.  A followup Dilantin level was at the high end of the  range at 19.1 and with his small stature we are going to reduce his  Dilantin to just twice a day.  This may improve overall patient  compliance.  He did have a two-dimensional echocardiogram which showed  an ejection fraction of 40-45% with diastolic dysfunction.  His oxygen  levels remained normal at 94% on room air.  Vital  signs remained stable.  By the afternoon of June 07, 2009, he was stable enough to be  discharged home, improved.   DISCHARGE MEDICATIONS:  He will be on a regular diet.  He was urged to  avoid alcohol.  He may increase activity as tolerated.  He is to return  to the care of his family doctor in 1 week for a followup Dilantin level  and BMET to follow up on his Dilantin and his potassium.   He is being discharged on Dilantin 100 mg twice a day, generic Toprol-XL  25 taking one-half tablet daily, Adult Low Strength aspirin 81 mg daily,  and K-Dur 20 mEq 1 daily.   Condition on discharge is improved.   Time spent in discussing discharge regimen with him and calling his  caregiver and going it over was greater than 30 minutes.           ______________________________  Cassell Clement, M.D.     TB/MEDQ  D:  06/07/2009  T:  06/08/2009  Job:  [id number]
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
## 2011-02-22 NOTE — H&P (Signed)
NAME:  Jeff Lowery, Jeff Lowery              ACCOUNT NO.:  [id number]   MEDICAL RECORD NO.:  [id number]          PATIENT TYPE:  INP   LOCATION:  6715                         FACILITY:  [facility]   PHYSICIAN:  Della Goo, M.D. DATE OF BIRTH:  December 13, 1945   DATE OF ADMISSION:  09/02/2007  DATE OF DISCHARGE:                              HISTORY & PHYSICAL   PRIMARY CARE PHYSICIAN:  This is an unassigned patient.   CHIEF COMPLAINT:  Seizure.   HISTORY OF PRESENT ILLNESS:  A 65 year old male with a reported seizure  behind a convenience store who was brought emergently to the hospital.  Supposedly the patient lives behind the convenience store and is  homeless.  The event was not witnessed, but the patient was found passed  out.  The patient became arousable and at the time of evaluation did  report that he had previous seizures, last seizure 4 months ago.  He  also does have a history of alcohol abuse.  The patient denies having  any headache, dizziness, chest pain at this time.  He denies having any  shortness of breath.  The patient was evaluated in the emergency  department.  A CT scan was performed of the head which did reveal a low  density area in the right parietal lobe.  The patient was referred for  admission.   PAST MEDICAL HISTORY:  None.   MEDICATIONS:  None.   PAST SURGICAL HISTORY:  None.   ALLERGIES:  NO KNOWN DRUG ALLERGIES.   SOCIAL HISTORY:  The patient is a smoker, smokes one pack per day and  also is a drinker and reports drinking off and on.  He is nonspecific  with the amount.   FAMILY HISTORY:  Noncontributory.   PHYSICAL EXAMINATION:  GENERAL:  This is a 65 year old disheveled male  who is currently awake and alert and in no visible discomfort.  VITAL SIGNS:  Temperature 98.4, blood pressure 157/89, heart rate 103,  respirations 18, O2 saturations 92% on room air.  HEENT:  Normocephalic, atraumatic.  He has scleral erythema.  There is  no jaundice.  Pupils are  equally round and reactive to light.  Extraocular muscles are intact.  Funduscopic benign.  Oropharynx poor.  Sparse dentition.  No tongue exudates.  NECK:  Supple.  Full range of motion.  No thyromegaly, adenopathy,  jugular venous distention.  CARDIOVASCULAR:  Mild tachycardia.  No murmurs, gallops, or rubs.  LUNGS:  Clear to auscultation bilaterally.  ABDOMEN:  Positive bowel sounds, soft, nontender, nondistended.  EXTREMITIES:  Without cyanosis, clubbing, or edema.  NEUROLOGIC:  The patient is alert and oriented x3.  His cranial nerves  are intact.  There are no focal deficits on examination.  There is no  asterixis.   LABORATORY STUDIES:  White blood cell count 9.9, hemoglobin 16.2,  hematocrit 49.5, platelets 170.  Sodium 139, potassium 5.1, chloride 97,  bicarb 33, BUN 19, creatinine 1.09, glucose 175.  Alcohol level less  than 4.  Urinalysis negative.   CT scan, as mentioned above.   ASSESSMENT:  A 65 year old male being admitted with:  1. Parietal  lobe abnormality on CT.  2. Seizure secondary to #1.  3. Syncope.  4. Alcohol abuse.  5. Tobacco abuse.   PLAN:  The patient will be admitted to telemetry, and neurologic checks  will be performed.  He will be placed on seizure precautions, and the  patient will be administered Dilantin therapy.  An MRI/MRA study has  been ordered of the brain as well to further evaluate the right parietal  area.  The patient will be placed on the alcohol withdrawal protocol.  Cardiac enzymes will also be performed, and case management will be  consulted.      Della Goo, M.D.  Electronically Signed     HJ/MEDQ  D:  09/03/2007  T:  09/04/2007  Job:  [id number]
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
## 2011-02-25 NOTE — Discharge Summary (Signed)
Jeff Lowery, Jeff Lowery              ACCOUNT NO.:  [id number]   MEDICAL RECORD NO.:  [id number]          PATIENT TYPE:  INP   LOCATION:  3740                         FACILITY:  [facility]   PHYSICIAN:  Wilson Singer, M.D.DATE OF BIRTH:  03-25-1946   DATE OF ADMISSION:  09/02/2007  DATE OF DISCHARGE:  09/05/2007                               DISCHARGE SUMMARY   FINAL DISCHARGE DIAGNOSIS:  1. Seizure, possibly withdrawal.  2. Atrial fibrillation reverted back to sinus rhythm.   MEDICATIONS ON DISCHARGE:  1. Dilantin 100 mg t.i.d.  2. Diltiazem 30 mg q.i.d.  3. Ativan 0.5 mg b.i.d.   CONDITION ON DISCHARGE:  Stable.   HISTORY:  This 65 year old man was admitted with seizure.  Please see  initial history and physical examination done by Dr. Della Goo.   HOSPITAL PROGRESS:  The patient had no further seizures while he was in  the hospital, and it was felt that these seizures were due to alcohol  withdrawal.  He has a history of alcohol excess.  He was, however,  loaded with Dilantin; and he remained stable throughout his hospital  stay, except for an episode of atrial fibrillation which converted back  to sinus rhythm.  He was put on Cardizem in view of this.  On the day of  discharge he was doing well, was afebrile, and hemodynamically stable;  so was able to be discharged home.  He will need to followup with his  primary care physician in due course.      Wilson Singer, M.D.  Electronically Signed     NCG/MEDQ  D:  09/18/2007  T:  09/19/2007  Job:  [id number]
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
## 2011-02-25 NOTE — Procedures (Signed)
EEG NUMBER:  [id number].   CLINICAL HISTORY:  This is a 60-year gentleman being evaluated for a  syncopal episode versus seizure.   MEDICATION LISTED:  Lovenox, Tylenol, oxycodone and Zofran.   This is a portable 17 channel study done with the patient in awake and  drowsy states using standard 10/20 electrode placement.   Background awake rhythm consists of 10 mHz alpha which is of moderate  amplitude synchronous reactive to eye-opening and closure.  Intermittent  7-8 Hz theta slowing is seen bilaterally in the temporal regions.  There  are occasional sharp waves seen in the left temporal region anteriorly.  However, no definite epileptiform activity is seen.  Sleep stages are  not seen in this EEG except for mild drowsiness which is uneventful.  Hyperventilation not performed.  Photic stimulation is also not done.  EKG tracing reveals regular sinus rhythm.   IMPRESSION:  This EEG performed during awake and drowsy states is  abnormal due to presence of mild left greater than right temporal  cortical irritability, however, no definitive epileptiform activity is  seen.  If seizures are strongly suspected, then repeat sleep deprived  study may be beneficial.           ______________________________  Sunny Schlein. Pearlean Brownie, MD     ZOX:WRUE  D:  10/31/2006 15:38:24  T:  10/31/2006 19:37:48  Job #:  [id number]
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
## 2011-07-04 LAB — POCT I-STAT, CHEM 8
BUN: 20
Chloride: 100
HCT: 56 — ABNORMAL HIGH
Potassium: 4.5

## 2011-07-04 LAB — PHENYTOIN LEVEL, TOTAL: Phenytoin Lvl: <2.5 — ABNORMAL LOW

## 2011-07-05 LAB — POCT I-STAT, CHEM 8
BUN: 15
Calcium, Ion: 1.08 — ABNORMAL LOW
Chloride: 98
Creatinine, Ser: 1.3
Glucose, Bld: 139 — ABNORMAL HIGH
HCT: 60 — ABNORMAL HIGH
Hemoglobin: 20.4 — ABNORMAL HIGH
Potassium: 4
Sodium: 138
TCO2: 35

## 2011-07-05 LAB — PHENYTOIN LEVEL, TOTAL: Phenytoin Lvl: <2.5 — ABNORMAL LOW

## 2011-07-08 LAB — PHENYTOIN LEVEL, TOTAL: Phenytoin Lvl: 4.5 — ABNORMAL LOW

## 2011-07-08 LAB — POCT I-STAT, CHEM 8
BUN: 20
Chloride: 101
HCT: 48
Sodium: 137
TCO2: 31

## 2011-07-19 LAB — COMPREHENSIVE METABOLIC PANEL
ALT: 11
ALT: 12
AST: 16
AST: 19
Albumin: 3.5
Albumin: 3.7
Alkaline Phosphatase: 40
Alkaline Phosphatase: 47
BUN: 16
CO2: 33 — ABNORMAL HIGH
CO2: 35 — ABNORMAL HIGH
Calcium: 9.1
Chloride: 96
Chloride: 99
Creatinine, Ser: 1.09
GFR calc Af Amer: >60
GFR calc non Af Amer: >60
Glucose, Bld: 87
Potassium: 4
Potassium: 4.1
Sodium: 139
Sodium: 141
Total Bilirubin: 1.1
Total Bilirubin: 1.1
Total Protein: 6.8

## 2011-07-19 LAB — DIFFERENTIAL
Basophils Absolute: 0
Basophils Relative: 0
Eosinophils Absolute: 0.1 — ABNORMAL LOW
Eosinophils Relative: 0
Eosinophils Relative: 1
Lymphocytes Relative: 8 — ABNORMAL LOW
Lymphs Abs: 0.8
Monocytes Absolute: 0.3
Monocytes Absolute: 0.6
Monocytes Relative: 6
Neutro Abs: 6.8

## 2011-07-19 LAB — CBC
HCT: 47.4
HCT: 52.7 — ABNORMAL HIGH
Hemoglobin: 15.7
Hemoglobin: 16.5
Hemoglobin: 17.3 — ABNORMAL HIGH
MCHC: 32.8
MCHC: 33
MCV: 100.6 — ABNORMAL HIGH
Platelets: 156
Platelets: 168
Platelets: 170
RBC: 4.92
RBC: 5.08
RDW: 13.7
WBC: 9.1

## 2011-07-19 LAB — URINALYSIS, ROUTINE W REFLEX MICROSCOPIC
Glucose, UA: NEGATIVE
Ketones, ur: NEGATIVE
Leukocytes, UA: NEGATIVE
Nitrite: NEGATIVE
Protein, ur: 30 — AB
pH: 5.5

## 2011-07-19 LAB — ETHANOL: Alcohol, Ethyl (B): <5

## 2011-07-19 LAB — CARDIAC PANEL(CRET KIN+CKTOT+MB+TROPI)
CK, MB: 1.1
CK, MB: 1.3
CK, MB: 1.3
Relative Index: INVALID
Total CK: 64
Troponin I: 0.01
Troponin I: 0.01
Troponin I: 0.02
Troponin I: 0.02

## 2011-07-19 LAB — BASIC METABOLIC PANEL
BUN: 15
Chloride: 96
GFR calc Af Amer: >60
GFR calc non Af Amer: >60
Potassium: 4.6
Sodium: 136

## 2011-07-19 LAB — TROPONIN I: Troponin I: 0.02

## 2011-07-19 LAB — CK TOTAL AND CKMB (NOT AT ARMC): CK, MB: 1.4

## 2011-07-19 LAB — URINE MICROSCOPIC-ADD ON

## 2017-01-04 ENCOUNTER — Emergency Department: Payer: Medicare Other

## 2017-01-04 ENCOUNTER — Inpatient Hospital Stay
Admission: EM | Admit: 2017-01-04 | Discharge: 2017-01-07 | DRG: 871 | Disposition: A | Discharge status: Skilled Nursing Facility | Payer: Medicare Other | Attending: Internal Medicine | Admitting: Internal Medicine

## 2017-01-04 ENCOUNTER — Encounter: Payer: Self-pay | Admitting: Internal Medicine

## 2017-01-04 DIAGNOSIS — G40909 Epilepsy, unspecified, not intractable, without status epilepticus: Secondary | ICD-10-CM | POA: Diagnosis present

## 2017-01-04 DIAGNOSIS — E870 Hyperosmolality and hypernatremia: Secondary | ICD-10-CM | POA: Diagnosis present

## 2017-01-04 DIAGNOSIS — R4182 Altered mental status, unspecified: Secondary | ICD-10-CM | POA: Diagnosis not present

## 2017-01-04 DIAGNOSIS — I519 Heart disease, unspecified: Secondary | ICD-10-CM | POA: Diagnosis present

## 2017-01-04 DIAGNOSIS — J9610 Chronic respiratory failure, unspecified whether with hypoxia or hypercapnia: Secondary | ICD-10-CM | POA: Diagnosis present

## 2017-01-04 DIAGNOSIS — I340 Nonrheumatic mitral (valve) insufficiency: Secondary | ICD-10-CM | POA: Diagnosis not present

## 2017-01-04 DIAGNOSIS — J189 Pneumonia, unspecified organism: Secondary | ICD-10-CM | POA: Diagnosis present

## 2017-01-04 DIAGNOSIS — F0390 Unspecified dementia without behavioral disturbance: Secondary | ICD-10-CM | POA: Diagnosis present

## 2017-01-04 DIAGNOSIS — I4891 Unspecified atrial fibrillation: Secondary | ICD-10-CM | POA: Diagnosis present

## 2017-01-04 DIAGNOSIS — J440 Chronic obstructive pulmonary disease with acute lower respiratory infection: Secondary | ICD-10-CM | POA: Diagnosis present

## 2017-01-04 DIAGNOSIS — A419 Sepsis, unspecified organism: Secondary | ICD-10-CM | POA: Diagnosis present

## 2017-01-04 DIAGNOSIS — Z7951 Long term (current) use of inhaled steroids: Secondary | ICD-10-CM

## 2017-01-04 DIAGNOSIS — I472 Ventricular tachycardia: Secondary | ICD-10-CM | POA: Diagnosis not present

## 2017-01-04 DIAGNOSIS — Y95 Nosocomial condition: Secondary | ICD-10-CM | POA: Diagnosis present

## 2017-01-04 DIAGNOSIS — N179 Acute kidney failure, unspecified: Secondary | ICD-10-CM | POA: Diagnosis present

## 2017-01-04 DIAGNOSIS — E039 Hypothyroidism, unspecified: Secondary | ICD-10-CM | POA: Diagnosis present

## 2017-01-04 HISTORY — DX: Chronic obstructive pulmonary disease, unspecified: J44.9

## 2017-01-04 HISTORY — DX: Hypothyroidism, unspecified: E03.9

## 2017-01-04 LAB — URINALYSIS, COMPLETE (UACMP) WITH MICROSCOPIC
Bilirubin Urine: NEGATIVE
GLUCOSE, UA: NEGATIVE mg/dL
Hgb urine dipstick: NEGATIVE
Ketones, ur: NEGATIVE mg/dL
Leukocytes, UA: NEGATIVE
Nitrite: NEGATIVE
PROTEIN: NEGATIVE mg/dL
Specific Gravity, Urine: 1.013 (ref 1.005–1.030)
pH: 5 (ref 5.0–8.0)

## 2017-01-04 LAB — COMPREHENSIVE METABOLIC PANEL
ALK PHOS: 57 U/L (ref 38–126)
ALT: 13 U/L — AB (ref 17–63)
AST: 15 U/L (ref 15–41)
Albumin: 2.8 g/dL — ABNORMAL LOW (ref 3.5–5.0)
Anion gap: 4 — ABNORMAL LOW (ref 5–15)
BUN: 61 mg/dL — AB (ref 6–20)
CHLORIDE: 112 mmol/L — AB (ref 101–111)
CO2: 34 mmol/L — AB (ref 22–32)
Calcium: 8.3 mg/dL — ABNORMAL LOW (ref 8.9–10.3)
Creatinine, Ser: 2.09 mg/dL — ABNORMAL HIGH (ref 0.61–1.24)
GFR calc non Af Amer: 30 mL/min — ABNORMAL LOW (ref >60)
GFR, EST AFRICAN AMERICAN: 35 mL/min — AB (ref >60)
GLUCOSE: 126 mg/dL — AB (ref 65–99)
Potassium: 4.3 mmol/L (ref 3.5–5.1)
SODIUM: 150 mmol/L — AB (ref 135–145)
Total Bilirubin: 0.6 mg/dL (ref 0.3–1.2)
Total Protein: 7.1 g/dL (ref 6.5–8.1)

## 2017-01-04 LAB — CBC WITH DIFFERENTIAL/PLATELET
Basophils Absolute: 0 10*3/uL (ref 0–0.1)
Basophils Relative: 0 %
EOS PCT: 1 %
Eosinophils Absolute: 0.1 10*3/uL (ref 0–0.7)
HCT: 32.8 % — ABNORMAL LOW (ref 40.0–52.0)
Hemoglobin: 10.5 g/dL — ABNORMAL LOW (ref 13.0–18.0)
LYMPHS ABS: 1.2 10*3/uL (ref 1.0–3.6)
LYMPHS PCT: 8 %
MCH: 30.1 pg (ref 26.0–34.0)
MCHC: 32 g/dL (ref 32.0–36.0)
MCV: 94.1 fL (ref 80.0–100.0)
Monocytes Absolute: 1.2 10*3/uL — ABNORMAL HIGH (ref 0.2–1.0)
Monocytes Relative: 7 %
Neutro Abs: 14.1 10*3/uL — ABNORMAL HIGH (ref 1.4–6.5)
Neutrophils Relative %: 84 %
PLATELETS: 176 10*3/uL (ref 150–440)
RBC: 3.48 MIL/uL — ABNORMAL LOW (ref 4.40–5.90)
RDW: 13.4 % (ref 11.5–14.5)
WBC: 16.7 10*3/uL — ABNORMAL HIGH (ref 3.8–10.6)

## 2017-01-04 LAB — BLOOD GAS, ARTERIAL
Acid-Base Excess: 8.8 mmol/L — ABNORMAL HIGH (ref 0.0–2.0)
Bicarbonate: 36.4 mmol/L — ABNORMAL HIGH (ref 20.0–28.0)
FIO2: 0.32
O2 SAT: 59.9 %
PATIENT TEMPERATURE: 37
PCO2 ART: 66 mmHg — AB (ref 32.0–48.0)
pH, Arterial: 7.35 (ref 7.350–7.450)
pO2, Arterial: 33 mmHg — CL (ref 83.0–108.0)

## 2017-01-04 LAB — LACTIC ACID, PLASMA
LACTIC ACID, VENOUS: 1.5 mmol/L (ref 0.5–1.9)
Lactic Acid, Venous: 1.3 mmol/L (ref 0.5–1.9)

## 2017-01-04 LAB — VANCOMYCIN, RANDOM: Vancomycin Rm: 10

## 2017-01-04 LAB — TSH: TSH: 1.465 u[IU]/mL (ref 0.350–4.500)

## 2017-01-04 LAB — MRSA PCR SCREENING: MRSA by PCR: NEGATIVE

## 2017-01-04 MED ORDER — ACETAMINOPHEN 325 MG PO TABS: 650.0000 mg | ORAL_TABLET | Freq: Four times a day (QID) | ORAL | Status: DC | PRN

## 2017-01-04 MED ORDER — RISPERIDONE 1 MG PO TABS
0.5000 mg | ORAL_TABLET | Freq: Every evening | ORAL | Status: DC
  Administered 2017-01-04 – 2017-01-06 (×2): 0.5 mg via ORAL
  Filled 2017-01-04 (×3): qty 1

## 2017-01-04 MED ORDER — NITROGLYCERIN 0.4 MG SL SUBL: 0.4000 mg | SUBLINGUAL_TABLET | SUBLINGUAL | Status: DC | PRN

## 2017-01-04 MED ORDER — SODIUM CHLORIDE 0.9 % IV SOLN
INTRAVENOUS | Status: DC
  Administered 2017-01-04 (×2): via INTRAVENOUS

## 2017-01-04 MED ORDER — DOCUSATE SODIUM 100 MG PO CAPS
100.0000 mg | ORAL_CAPSULE | Freq: Two times a day (BID) | ORAL | Status: DC
  Administered 2017-01-04 – 2017-01-07 (×7): 100 mg via ORAL
  Filled 2017-01-04 (×7): qty 1

## 2017-01-04 MED ORDER — LEVETIRACETAM 500 MG PO TABS
500.0000 mg | ORAL_TABLET | Freq: Two times a day (BID) | ORAL | Status: DC
  Administered 2017-01-04 – 2017-01-07 (×7): 500 mg via ORAL
  Filled 2017-01-04 (×7): qty 1

## 2017-01-04 MED ORDER — ALBUTEROL SULFATE (2.5 MG/3ML) 0.083% IN NEBU: 2.5000 mg | INHALATION_SOLUTION | RESPIRATORY_TRACT | Status: DC | PRN

## 2017-01-04 MED ORDER — DONEPEZIL HCL 5 MG PO TABS
10.0000 mg | ORAL_TABLET | Freq: Every day | ORAL | Status: DC
  Administered 2017-01-04 – 2017-01-06 (×3): 10 mg via ORAL
  Filled 2017-01-04 (×3): qty 2

## 2017-01-04 MED ORDER — FLUTICASONE FUROATE-VILANTEROL 100-25 MCG/INH IN AEPB
1.0000 | INHALATION_SPRAY | Freq: Every day | RESPIRATORY_TRACT | Status: DC
  Administered 2017-01-04 – 2017-01-07 (×4): 1 via RESPIRATORY_TRACT
  Filled 2017-01-04: qty 28

## 2017-01-04 MED ORDER — SODIUM CHLORIDE 0.9 % IV BOLUS (SEPSIS)
1000.0000 mL | Freq: Once | INTRAVENOUS | Status: AC
  Administered 2017-01-04: 1000 mL via INTRAVENOUS

## 2017-01-04 MED ORDER — PANTOPRAZOLE SODIUM 40 MG PO TBEC
40.0000 mg | DELAYED_RELEASE_TABLET | Freq: Every day | ORAL | Status: DC
  Administered 2017-01-04 – 2017-01-07 (×4): 40 mg via ORAL
  Filled 2017-01-04 (×4): qty 1

## 2017-01-04 MED ORDER — SODIUM CHLORIDE 0.9 % IV BOLUS (SEPSIS)
500.0000 mL | Freq: Once | INTRAVENOUS | Status: AC
  Administered 2017-01-04: 500 mL via INTRAVENOUS

## 2017-01-04 MED ORDER — SODIUM CHLORIDE 0.9 % IV BOLUS (SEPSIS)
250.0000 mL | Freq: Once | INTRAVENOUS | Status: AC
  Administered 2017-01-04: 250 mL via INTRAVENOUS

## 2017-01-04 MED ORDER — VANCOMYCIN HCL IN DEXTROSE 1-5 GM/200ML-% IV SOLN
1000.0000 mg | Freq: Once | INTRAVENOUS | Status: AC
  Administered 2017-01-04: 1000 mg via INTRAVENOUS
  Filled 2017-01-04: qty 200

## 2017-01-04 MED ORDER — HEPARIN SODIUM (PORCINE) 5000 UNIT/ML IJ SOLN
5000.0000 [IU] | Freq: Three times a day (TID) | INTRAMUSCULAR | Status: DC
  Administered 2017-01-04 – 2017-01-07 (×9): 5000 [IU] via SUBCUTANEOUS
  Filled 2017-01-04 (×10): qty 1

## 2017-01-04 MED ORDER — DEXTROSE 5 % IV SOLN
2.0000 g | Freq: Once | INTRAVENOUS | Status: AC
  Administered 2017-01-04: 2 g via INTRAVENOUS
  Filled 2017-01-04: qty 2

## 2017-01-04 MED ORDER — ONDANSETRON HCL 4 MG PO TABS: 4.0000 mg | ORAL_TABLET | Freq: Four times a day (QID) | ORAL | Status: DC | PRN

## 2017-01-04 MED ORDER — LEVOTHYROXINE SODIUM 25 MCG PO TABS
25.0000 ug | ORAL_TABLET | Freq: Every day | ORAL | Status: DC
  Administered 2017-01-05 – 2017-01-07 (×3): 25 ug via ORAL
  Filled 2017-01-04 (×3): qty 1

## 2017-01-04 MED ORDER — TIOTROPIUM BROMIDE MONOHYDRATE 18 MCG IN CAPS
18.0000 ug | ORAL_CAPSULE | Freq: Every day | RESPIRATORY_TRACT | Status: DC
  Administered 2017-01-05 – 2017-01-07 (×3): 18 ug via RESPIRATORY_TRACT
  Filled 2017-01-04: qty 5

## 2017-01-04 MED ORDER — DEXTROSE 5 % IV SOLN
2.0000 g | INTRAVENOUS | Status: DC
  Administered 2017-01-05: 2 g via INTRAVENOUS
  Filled 2017-01-04: qty 2

## 2017-01-04 MED ORDER — OMEPRAZOLE 40 MG PO CPDR
40.0000 mg | DELAYED_RELEASE_CAPSULE | Freq: Every day | ORAL | Status: DC
  Filled 2017-01-04: qty 1

## 2017-01-04 MED ORDER — ONDANSETRON HCL 4 MG/2ML IJ SOLN: 4.0000 mg | Freq: Four times a day (QID) | INTRAMUSCULAR | Status: DC | PRN

## 2017-01-04 MED ORDER — ACETAMINOPHEN 650 MG RE SUPP: 650.0000 mg | Freq: Four times a day (QID) | RECTAL | Status: DC | PRN

## 2017-01-04 MED ORDER — SODIUM CHLORIDE 0.9% FLUSH
3.0000 mL | Freq: Two times a day (BID) | INTRAVENOUS | Status: DC
  Administered 2017-01-04 – 2017-01-07 (×6): 3 mL via INTRAVENOUS
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
## 2017-01-04 NOTE — ED Notes (Signed)
[facility] NP Lance Boschndrea Blevins [phone]) updated regarding pt admission status. NP stated that [facility] if necessary once pt is stable enough to return per MD. States that [facility] but was also on palliative care. States that pt is able to ambulate and perform some ADLs with minimal assistance at baseline.

## 2017-01-04 NOTE — Progress Notes (Addendum)
Pharmacy Antibiotic Note  Jeff Lowery is a 71 y.o. male admitted on 01/04/2017 with sepsis.  Pharmacy has been consulted for vancomycin/cefepime dosing.  Plan: Patient received vanc 1g IV x 1 and cefepime 2g IV x 1 in the ED  Patient is currently in AKI so will dose based on random levels until renal function stabilizes. Will check a VR 3/29 @ 1500 and dose w/ vanc 750 mg (15 mg/kg) if random level is < 20. Will start zosyn 3.375g IV q8h per extended infusion.  Height: 5\' 7"  (170.2 cm) Weight: 115 lb (52.2 kg) IBW/kg (Calculated) : 66.1  Temp (24hrs), Avg:98.1 F (36.7 C), Min:98.1 F (36.7 C), Max:98.1 F (36.7 C)   Recent Labs Lab 01/04/17 0245 01/04/17 0248  WBC 16.7*  --   CREATININE 2.09*  --   LATICACIDVEN  --  1.3    Estimated Creatinine Clearance: 23.6 mL/min (A) (by C-G formula based on SCr of 2.09 mg/dL (H)).    No Known Allergies   Thank you for allowing pharmacy to be a part of this patient's care.  Thomasene Rippleavid Marquon Alcala, PharmD, BCPS Clinical Pharmacist 01/04/2017

## 2017-01-04 NOTE — ED Notes (Signed)
Pt took medications in applesauce.  

## 2017-01-04 NOTE — Progress Notes (Signed)
ABG results. Blood gas machine down.  PH 7.35 PCO2 66 Po2 33 BE 8.8 HCO3 36.4  Left radial, 3 liter nasal cannula. Dr. Manson PasseyBrown Informed

## 2017-01-04 NOTE — ED Triage Notes (Signed)
Pt dx'd yesterday w/ pneumonia. RN @ facility was giving subcutaneous fluid bolus. Access in place in RLQ of abdomen ( butterfly needle inserteed subcutaneously in abdomen, clamped off by EMS, total infusion prior to arrival approximately 100 ml. Subcutaneous access discontinued by this RN. Pt has no complaints, lung sounds coarse w/ congested, wet cough, rhonchi throughout, wheezing. Pt is hypoxic on room air and 2L O2. Pt placed on 4L O2/[place]. Pt denies hx of lung disease. Pt has atrial fibrilation at controlled rate under 100 bpm.

## 2017-01-04 NOTE — ED Notes (Signed)
Pt able to sit upright in bed. Ate x1 pudding cup, x1 apple sauce cup, x1 nectar thick iced tea, and 1/2 carton of thickened milk. Pt is able to answer simple yes/no questions at this time. Denies pain. BP sitting up 114/64. Charge RN notified pt may no longer need stepdown level of care pending MD approval. Attending MD paged, awaiting callback.

## 2017-01-04 NOTE — ED Notes (Addendum)
Pt remains with BP in 90s/50s despite aggressive fluid resuscitation. Pt also remains minimally responsive with eye opening to voice but unfocused gaze, no speech, and accessory muscle use with respiration. Attending MD paged. Pt to be changed to step down level of care at this time. AC notified.

## 2017-01-04 NOTE — Plan of Care (Signed)
Problem: Education: Goal: Knowledge of [facility] Education information/materials will improve Outcome: Not Progressing Variance: Physical/mental limitations Comments: Patient has Dementia

## 2017-01-04 NOTE — ED Notes (Signed)
Pt's BP 88/52 after bolus completed. Pt connected to continuous IVF as ordered and placed in trendelenburg position. BP after 5 mins 110/53. Pt responds to voice by opening eyes, but does not make any attempt to speak with nurse at this time and appears to rapidly fall back asleep. All other VS WNL at this time on 3L O2 via n/c.

## 2017-01-05 LAB — CBC
HEMATOCRIT: 30.1 % — AB (ref 40.0–52.0)
Hemoglobin: 10 g/dL — ABNORMAL LOW (ref 13.0–18.0)
MCH: 31.2 pg (ref 26.0–34.0)
MCHC: 33.2 g/dL (ref 32.0–36.0)
MCV: 94.2 fL (ref 80.0–100.0)
Platelets: 181 10*3/uL (ref 150–440)
RBC: 3.2 MIL/uL — AB (ref 4.40–5.90)
RDW: 13.4 % (ref 11.5–14.5)
WBC: 10.4 10*3/uL (ref 3.8–10.6)

## 2017-01-05 LAB — URINE CULTURE: Culture: <10000 — AB

## 2017-01-05 LAB — BASIC METABOLIC PANEL
Anion gap: 3 — ABNORMAL LOW (ref 5–15)
BUN: 36 mg/dL — AB (ref 6–20)
CHLORIDE: 121 mmol/L — AB (ref 101–111)
CO2: 30 mmol/L (ref 22–32)
Calcium: 8.5 mg/dL — ABNORMAL LOW (ref 8.9–10.3)
Creatinine, Ser: 1.16 mg/dL (ref 0.61–1.24)
GFR calc non Af Amer: >60 mL/min (ref >60)
Glucose, Bld: 101 mg/dL — ABNORMAL HIGH (ref 65–99)
POTASSIUM: 4.1 mmol/L (ref 3.5–5.1)
SODIUM: 154 mmol/L — AB (ref 135–145)

## 2017-01-05 LAB — HEMOGLOBIN A1C
Hgb A1c MFr Bld: 5.2 % (ref 4.8–5.6)
Mean Plasma Glucose: 103 mg/dL

## 2017-01-05 MED ORDER — SODIUM CHLORIDE 0.45 % IV SOLN
INTRAVENOUS | Status: DC
  Administered 2017-01-05 – 2017-01-06 (×2): via INTRAVENOUS

## 2017-01-05 MED ORDER — DEXTROSE 5 % IV SOLN
2.0000 g | Freq: Two times a day (BID) | INTRAVENOUS | Status: DC
  Administered 2017-01-05 – 2017-01-07 (×4): 2 g via INTRAVENOUS
  Filled 2017-01-05 (×6): qty 2

## 2017-01-05 NOTE — Clinical Social Work Note (Signed)
Clinical Social Work Assessment  Patient Details  Name: Jeff Lowery MRN: [id number] Date of Birth: 1946/01/13  Date of referral:  01/05/17               Reason for consult:  Facility Placement, Discharge Planning                Permission sought to share information with:  Chartered certified accountant granted to share information::  Yes, Verbal Permission Granted  Name::      [facility]  Agency::   [place]   Relationship::     Contact Information:     Housing/Transportation Living arrangements for the past 2 months:  [facility]) Source of Information:  Patient Patient Interpreter Needed:  None Criminal Activity/Legal Involvement Pertinent to Current Situation/Hospitalization:  No - Comment as needed Significant Relationships:  Other Family Members Lives with:  Facility Resident [facility]) Do you feel safe going back to the place where you live?  Yes Need for family participation in patient care:  Yes (Comment)  Care giving concerns:  Patient is a long-term care resident at [facility].    Social Worker assessment / plan:  Social work Theatre manager noted that patient was from facility. PT has not worked with patient at this time. Social work Theatre manager met with patient alone at bedside. Patient was sitting up in bed. Social work Theatre manager introduced herself and explained role of social work department. Per patient, he is a resident at [facility] and has been there for several years now.  Marden Noble, admission coordinator at [facility], confirmed patient is a long-term care resident at facility and can return when medically stable. Patient stated he does not have any children and does not have HPOA. Social work Theatre manager attempted to call members listed on facesheet to gather more information. Social work Theatre manager left message with [employer]. Social  work Theatre manager explained to patient that the social work department will work on getting him back to [facility] when medically stable. Patient verbally agreed he understood and wants to go back. Social work Theatre manager will continue to assist and follow as needed.   Fl2 completed and faxed out.   Employment status:  Unemployed Nurse, adult PT Recommendations:  Not assessed at this time Information / Referral to community resources:  [place]  Patient/Family's Response to care:  Patient is agreeable to return to [facility].   Patient/Family's Understanding of and Emotional Response to Diagnosis, Current Treatment, and Prognosis:  Patient seemed confused, but answered questions appropriately. Patient was pleasant and thanked social work Theatre manager for visiting.   Emotional Assessment Appearance:  Appears stated age Attitude/Demeanor/Rapport:    Affect (typically observed):  Accepting, Adaptable, Appropriate Orientation:  Oriented to Self, Oriented to Place, Oriented to  Time, Oriented to Situation Alcohol / Substance use:  Not Applicable Psych involvement (Current and /or in the community):  No (Comment)  Discharge Needs  Concerns to be addressed:  Basic Needs Readmission within the last 30 days:  No Current discharge risk:  Terminally ill Barriers to Discharge:  Continued Medical Work up   [employer], [place] Work 01/05/2017, 10:08 AM

## 2017-01-05 NOTE — Progress Notes (Signed)
Pharmacy Antibiotic Note  Jeff Lowery is a 71 y.o. male admitted on 01/04/2017 with sepsis.  Pharmacy has been consulted for Cefepime   Plan: Patient's renal function continues to improve; therefore will change to Cefepime 2 g IV q12 hours.    Height: 5\' 7"  (170.2 cm) Weight: 136 lb 9.6 oz (62 kg) IBW/kg (Calculated) : 66.1  Temp (24hrs), Avg:98.2 F (36.8 C), Min:97.9 F (36.6 C), Max:98.8 F (37.1 C)   Recent Labs Lab 01/04/17 0245 01/04/17 0248 01/04/17 0534 01/04/17 1558 01/05/17 0515  WBC 16.7*  --   --   --  10.4  CREATININE 2.09*  --   --   --  1.16  LATICACIDVEN  --  1.3 1.5  --   --   VANCORANDOM  --   --   --  10  --     Estimated Creatinine Clearance: 50.5 mL/min (by C-G formula based on SCr of 1.16 mg/dL).    No Known Allergies   Thank you for allowing pharmacy to be a part of this patient's care.  Demetrius Charityeldrin D. Ranger, PharmD  Clinical Pharmacist 01/05/2017

## 2017-01-05 NOTE — Progress Notes (Signed)
Initial Nutrition Assessment  DOCUMENTATION CODES:   Not applicable  INTERVENTION:  1. Magic cup TID with meals, each supplement provides 290 kcal and 9 grams of protein 2. NDD2 - nectar thick liquids  NUTRITION DIAGNOSIS:   Inadequate oral intake related to lethargy/confusion as evidenced by per patient/family report.  GOAL:   Patient will meet greater than or equal to 90% of their needs  MONITOR:   PO intake, I & O's, Labs, Weight trends, Supplement acceptance  REASON FOR ASSESSMENT:   Other (Comment) (Low BMI)    ASSESSMENT:   The patient with past medical history of COPD and hypothyroidism presents to the emergency department from his nursing home due to altered mental status  Spoke with pt briefly, he provided some answers though most of them were inappropriate. Claims his weight as "100 something." States he is "eating good." No documented meal completion thus far. No family available to provide hx. Nutrition-Focused physical exam completed. Findings are moderate fat depletion (at orbitals), moderate-severe muscle depletion (at temples), and no edema.  Labs and medications reviewed: Na 154 Colace  Diet Order:  DIET DYS 2 Room service appropriate? Yes; Fluid consistency: Nectar Thick  Skin:  Reviewed, no issues  Last BM:  PTA  Height:   Ht Readings from Last 1 Encounters:  01/04/17 5\' 7"  (1.702 m)    Weight:   Wt Readings from Last 1 Encounters:  01/05/17 136 lb 9.6 oz (62 kg)    Ideal Body Weight:  67.27 kg  BMI:  Body mass index is 21.39 kg/m.  Estimated Nutritional Needs:   Kcal:  1550 - 1850 calories  Protein:  62-74 gm  Fluid:  >/= 1.5L  EDUCATION NEEDS:   No education needs identified at this time  Dionne AnoWilliam M. Kyon Bentler, MS, RD LDN Inpatient Clinical Dietitian Pager [phone]

## 2017-01-05 NOTE — NC FL2 (Signed)
[place] MEDICAID FL2 LEVEL OF CARE SCREENING TOOL     IDENTIFICATION  Patient Name: Jeff Lowery Birthdate: 1945-12-09 Sex: male Admission Date (Current Location): 01/04/2017  [facility] and [place] Number:  [address]  ([id number]) Facility and Address:  [facility], [address], [place], [place] [address]      Provider Number: [id number]  Attending Physician Name and Address:  Altamese Dilling, MD  Relative Name and Phone Number:       Current Level of Care: Hospital Recommended Level of Care: Skilled Nursing Facility Prior Approval Number:    Date Approved/Denied:   PASRR Number:  [id number] )  Discharge Plan: SNF    Current Diagnoses: Patient Active Problem List   Diagnosis Date Noted  . Sepsis (HCC) 01/04/2017  . HCAP (healthcare-associated pneumonia) 01/04/2017    Orientation RESPIRATION BLADDER Height & Weight     Self, Place  O2 (3 Liters Oxygen ) Incontinent Weight: 136 lb 9.6 oz (62 kg) Height:  5\' 7"  (170.2 cm)  BEHAVIORAL SYMPTOMS/MOOD NEUROLOGICAL BOWEL NUTRITION STATUS   (none )  (none) Continent Diet ([place]: DYS 2 )  AMBULATORY STATUS COMMUNICATION OF NEEDS Skin   Extensive Assist Verbally Normal                       Personal Care Assistance Level of Assistance  Bathing, Feeding, Dressing Bathing Assistance: Limited assistance Feeding assistance: Limited assistance Dressing Assistance: Limited assistance     Functional Limitations Info  Sight, Hearing, Speech Sight Info: Adequate Hearing Info: Adequate Speech Info: Adequate    SPECIAL CARE FACTORS FREQUENCY                       Contractures      Additional Factors Info  Code Status, Allergies Code Status Info:  (Full Code. ) Allergies Info:  (No Known Allergies. )           Current Medications (01/05/2017):  This is the current hospital active medication list Current Facility-Administered Medications  Medication Dose Route  Frequency Provider Last Rate Last Dose  . 0.9 %  sodium chloride infusion   Intravenous Continuous Arnaldo Natal, MD 125 mL/hr at 01/04/17 1559    . acetaminophen (TYLENOL) tablet 650 mg  650 mg Oral Q6H PRN Arnaldo Natal, MD       Or  . acetaminophen (TYLENOL) suppository 650 mg  650 mg Rectal Q6H PRN Arnaldo Natal, MD      . albuterol (PROVENTIL) (2.5 MG/3ML) 0.083% nebulizer solution 2.5 mg  2.5 mg Nebulization Q4H PRN Arnaldo Natal, MD      . ceFEPIme (MAXIPIME) 2 g in dextrose 5 % 50 mL IVPB  2 g Intravenous Q24H Arnaldo Natal, MD   2 g at 01/05/17 0134  . docusate sodium (COLACE) capsule 100 mg  100 mg Oral BID Arnaldo Natal, MD   100 mg at 01/05/17 0816  . donepezil (ARICEPT) tablet 10 mg  10 mg Oral QHS Arnaldo Natal, MD   10 mg at 01/04/17 2252  . fluticasone furoate-vilanterol (BREO ELLIPTA) 100-25 MCG/INH 1 puff  1 puff Inhalation Daily Arnaldo Natal, MD   1 puff at 01/05/17 0815  . heparin injection 5,000 Units  5,000 Units Subcutaneous Q8H Arnaldo Natal, MD   5,000 Units at 01/05/17 0441  . levETIRAcetam (KEPPRA) tablet 500 mg  500 mg Oral BID Arnaldo Natal, MD   500 mg at  01/05/17 0816  . levothyroxine (SYNTHROID, LEVOTHROID) tablet 25 mcg  25 mcg Oral QAC breakfast Arnaldo NatalMichael S Diamond, MD   25 mcg at 01/05/17 0816  . nitroGLYCERIN (NITROSTAT) SL tablet 0.4 mg  0.4 mg Sublingual Q5 min PRN Arnaldo NatalMichael S Diamond, MD      . ondansetron [facility](ZOFRAN) tablet 4 mg  4 mg Oral Q6H PRN Arnaldo NatalMichael S Diamond, MD       Or  . ondansetron [facility](ZOFRAN) injection 4 mg  4 mg Intravenous Q6H PRN Arnaldo NatalMichael S Diamond, MD      . pantoprazole (PROTONIX) EC tablet 40 mg  40 mg Oral Daily Altamese DillingVaibhavkumar Yanitza Shvartsman, MD   40 mg at 01/05/17 0816  . risperiDONE (RISPERDAL) tablet 0.5 mg  0.5 mg Oral QPM Arnaldo NatalMichael S Diamond, MD   0.5 mg at 01/04/17 1844  . sodium chloride flush (NS) 0.9 % injection 3 mL  3 mL Intravenous Q12H Arnaldo NatalMichael S Diamond, MD   3 mL at 01/04/17 2256  . tiotropium (SPIRIVA)  inhalation capsule 18 mcg  18 mcg Inhalation Daily Arnaldo NatalMichael S Diamond, MD         Discharge Medications: Please see discharge summary for a list of discharge medications.  Relevant Imaging Results:  Relevant Lab Results:   Additional Information  (SSN: [id number])  Sample, Darleen CrockerBailey M, LCSW
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
## 2017-01-06 ENCOUNTER — Inpatient Hospital Stay (HOSPITAL_COMMUNITY)
Admit: 2017-01-06 | Discharge: 2017-01-06 | Disposition: A | Discharge status: Home / Self Care | Payer: Medicare Other | Attending: Internal Medicine | Admitting: Internal Medicine

## 2017-01-06 DIAGNOSIS — I340 Nonrheumatic mitral (valve) insufficiency: Secondary | ICD-10-CM

## 2017-01-06 LAB — BASIC METABOLIC PANEL
Anion gap: 2 — ABNORMAL LOW (ref 5–15)
BUN: 28 mg/dL — ABNORMAL HIGH (ref 6–20)
CALCIUM: 8.6 mg/dL — AB (ref 8.9–10.3)
CO2: 31 mmol/L (ref 22–32)
Chloride: 120 mmol/L — ABNORMAL HIGH (ref 101–111)
Creatinine, Ser: 1.21 mg/dL (ref 0.61–1.24)
GFR calc Af Amer: >60 mL/min (ref >60)
GFR, EST NON AFRICAN AMERICAN: 58 mL/min — AB (ref >60)
Glucose, Bld: 93 mg/dL (ref 65–99)
Potassium: 4 mmol/L (ref 3.5–5.1)
Sodium: 153 mmol/L — ABNORMAL HIGH (ref 135–145)

## 2017-01-06 LAB — MAGNESIUM: Magnesium: 2.1 mg/dL (ref 1.7–2.4)

## 2017-01-06 LAB — SODIUM
SODIUM: 153 mmol/L — AB (ref 135–145)
SODIUM: 151 mmol/L — AB (ref 135–145)
SODIUM: 150 mmol/L — AB (ref 135–145)

## 2017-01-06 LAB — ECHOCARDIOGRAM COMPLETE
Height: 67 in
Weight: 2185.6 oz

## 2017-01-06 MED ORDER — DEXTROSE 5 % IV SOLN
INTRAVENOUS | Status: DC
  Administered 2017-01-06 (×2): via INTRAVENOUS

## 2017-01-06 NOTE — Progress Notes (Signed)
[employer] - [place] at [facility]   PATIENT NAME: Jeff Lowery    MR#:  [id number]  DATE OF BIRTH:  1946-05-06  SUBJECTIVE:  Patient with 14 beat   REVIEW OF SYSTEMS:    Review of Systems  Unable to perform ROS: Dementia    Tolerating Diet: yes      DRUG ALLERGIES:  No Known Allergies  VITALS:  Blood pressure (!) 119/59, pulse 69, temperature 97.9 F (36.6 C), temperature source Oral, resp. rate 18, height  (1.702 m), weight 62 kg (136 lb 9.6 oz), SpO2 96 %.  PHYSICAL EXAMINATION:   Physical Exam  Constitutional: He is well-developed, well-nourished, and in no distress. No distress.  HENT:  Head: Normocephalic.  Eyes: No scleral icterus.  Neck: Normal range of motion. Neck supple. No JVD present. No tracheal deviation present.  Cardiovascular: Normal rate, regular rhythm and normal heart sounds.  Exam reveals no gallop and no friction rub.   No murmur heard. Pulmonary/Chest: Effort normal and breath sounds normal. No respiratory distress. He has no wheezes. He has no rales. He exhibits no tenderness.  rhonchii  Abdominal: Soft. Bowel sounds are normal. He exhibits no distension and no mass. There is no tenderness. There is no rebound and no guarding.  Musculoskeletal: Normal range of motion. He exhibits no edema.  Neurological: He is alert.  Oriented to name and place  Skin: Skin is warm. No rash noted. No erythema.      LABORATORY PANEL:   CBC  Recent Labs Lab 01/05/17 0515  WBC 10.4  HGB 10.0*  HCT 30.1*  PLT 181   ------------------------------------------------------------------------------------------------------------------  Chemistries   Recent Labs Lab 01/04/17 0245  01/06/17 0539 01/06/17 0807  NA 150*  < > 153* 153*  K 4.3  < > 4.0  --   CL 112*  < > 120*  --   CO2 34*  < > 31  --   GLUCOSE 126*  < > 93  --   BUN 61*  < > 28*  --   CREATININE 2.09*  < > 1.21  --   CALCIUM 8.3*  < > 8.6*  --   AST 15  --    --   --   ALT 13*  --   --   --   ALKPHOS 57  --   --   --   BILITOT 0.6  --   --   --   < > = values in this interval not displayed. ------------------------------------------------------------------------------------------------------------------  Cardiac Enzymes No results for input(s): TROPONINI in the last 168 hours. ------------------------------------------------------------------------------------------------------------------  RADIOLOGY:  No results found.   ASSESSMENT AND PLAN:   71 year old male from skilled nursing facility who presented with sepsis and lethargy  1. Sepsis with leukocytosis and tachypnea Sepsis due to HCAP Continue cefepime MRSA PCR was negative and therefore vancomycin was discontinued  2. Chronic respiratory failure on 2 L oxygen and COPD without exacerbation  3. Hypernatremia from poor by mouth intake: Start D5W and repeat sodium level every 6 hours   4. Hypothyroidism: Continue Synthroid  Experience asymptomatic V. tach: Obtain echocardiogram and magnesium level Potassium was 4.0 this morning  CODE STATUS: FULL  TOTAL TIME TAKING CARE OF THIS PATIENT: 27 minutes.     POSSIBLE D/C tomorrow, DEPENDING ON CLINICAL CONDITION.   Aulton Routt M.D on 01/06/2017 at 10:05 AM  Between 7am to 6pm - Pager - [phone] After 6pm go to [email or web address] - password [email or web address]  [facility]  [place] [facility]  Office  [phone]  CC: Primary care physician; No PCP Per Patient  Note: This dictation was prepared with Dragon dictation along with smaller phrase technology. Any transcriptional errors that result from this process are unintentional.

## 2017-01-06 NOTE — Progress Notes (Signed)
*  PRELIMINARY RESULTS* Echocardiogram 2D Echocardiogram has been performed.  Jeff Lowery Jeff Lowery 01/06/2017, 12:38 PM

## 2017-01-06 NOTE — Progress Notes (Signed)
CCMD notified this RN that patient had a 5 beat run of v-Tach. BP 120/63, HR 63, 96% 2L. Notified MD.  New orders to notified MD if patient has a run of 10 beats or more.

## 2017-01-06 NOTE — Progress Notes (Signed)
Patient is A&O x3, but forgetful. Incont of urine and stool. LBM 3/30. Meds in applesauce. Tele on place. See NN. On 2L O2, chronic.Total care patient. Sodium still elevated, changed IV fluids this shift.

## 2017-01-06 NOTE — Progress Notes (Addendum)
Per MD patient will D/C tomorrow. Plan is for patient to return to [facility] where he is a long term care resident. Doug admissions coordinator at [facility] is aware of above and stated that patient can return to room 84-A. Clinical Social Worker (CSW) will continue to follow and assist as needed.   [employer], LCSW [phone]

## 2017-01-06 NOTE — Progress Notes (Signed)
Pharmacy Antibiotic Note  Jeff Lowery is a 71 y.o. male admitted on 01/04/2017 with sepsis.  Pharmacy has been consulted for Cefepime   Plan: Continue Cefepime 2 g Iv q12 hours.   Height:  (170.2 cm) Weight: 136 lb 9.6 oz (62 kg) IBW/kg (Calculated) : 66.1  Temp (24hrs), Avg:98.2 F (36.8 C), Min:97 F (36.1 C), Max:99 F (37.2 C)   Recent Labs Lab 01/04/17 0245 01/04/17 0248 01/04/17 0534 01/04/17 1558 01/05/17 0515 01/06/17 0539  WBC 16.7*  --   --   --  10.4  --   CREATININE 2.09*  --   --   --  1.16 1.21  LATICACIDVEN  --  1.3 1.5  --   --   --   VANCORANDOM  --   --   --  10  --   --     Estimated Creatinine Clearance: 48.4 mL/min (by C-G formula based on SCr of 1.21 mg/dL).    No Known Allergies   Thank you for allowing pharmacy to be a part of this patient's care.  Demetrius Charity, PharmD  Clinical Pharmacist 01/06/2017

## 2017-01-06 NOTE — Progress Notes (Signed)
Tele monitor notified this RN of a 14 beat run of v-tach. MD in room with patient. VSS.

## 2017-01-07 LAB — BASIC METABOLIC PANEL
ANION GAP: 3 — AB (ref 5–15)
BUN: 19 mg/dL (ref 6–20)
CHLORIDE: 110 mmol/L (ref 101–111)
CO2: 33 mmol/L — ABNORMAL HIGH (ref 22–32)
Calcium: 8.8 mg/dL — ABNORMAL LOW (ref 8.9–10.3)
Creatinine, Ser: 1.09 mg/dL (ref 0.61–1.24)
GFR calc non Af Amer: >60 mL/min (ref >60)
Glucose, Bld: 111 mg/dL — ABNORMAL HIGH (ref 65–99)
POTASSIUM: 3.6 mmol/L (ref 3.5–5.1)
SODIUM: 146 mmol/L — AB (ref 135–145)

## 2017-01-07 LAB — SODIUM: Sodium: 148 mmol/L — ABNORMAL HIGH (ref 135–145)

## 2017-01-07 MED ORDER — AMOXICILLIN-POT CLAVULANATE 875-125 MG PO TABS: 1.0000 | ORAL_TABLET | Freq: Two times a day (BID) | ORAL | 0 refills | Status: AC

## 2017-01-07 NOTE — Progress Notes (Signed)
Patient discharging to [facility]. Report called. EMS called. Waiting on transport.

## 2017-01-07 NOTE — Clinical Social Work Note (Signed)
Patient will dc to his LTC room at [facility] via non emergent EMS. Facility is aware. CSW attempted to contact family to update with no success. CSW will con't to follow pending additional dc needs.  Argentina Ponder, MSW, Theresia Majors [phone]

## 2017-01-07 NOTE — Discharge Summary (Signed)
[facility] - [place] [facility]   PATIENT NAME: Jeff Lowery    MR#:  [id number]  DATE OF BIRTH:  1945-12-18  DATE OF ADMISSION:  01/04/2017 ADMITTING PHYSICIAN: Altamese Dilling, MD  DATE OF DISCHARGE: 01/07/2017  PRIMARY CARE PHYSICIAN: No PCP Per Patient    ADMISSION DIAGNOSIS:  HCAP (healthcare-associated pneumonia) [J18.9] Sepsis, due to unspecified organism (HCC) [A41.9]  DISCHARGE DIAGNOSIS:  Active Problems:   Sepsis (HCC)   HCAP (healthcare-associated pneumonia)   SECONDARY DIAGNOSIS:   Past Medical History:  Diagnosis Date  . COPD (chronic obstructive pulmonary disease) (HCC)   . Hypothyroidism     HOSPITAL COURSE:   71 year old male from skilled nursing facility who presented with sepsis and lethargy  1. Sepsis with leukocytosis and tachypnea on admission Sepsis was due to HCAP He was initiated on cefepime. He has been afebrile. He will be discharged on oral Augmentin  MRSA PCR was negative and therefore vancomycin was discontinued  2. Chronic respiratory failure on 2 L oxygen and COPD without exacerbation  3. Hypernatremia from poor by mouth intake: His sodium levels have improved.   4. Hypothyroidism: Continue Synthroid  5. Asymptomatic V. tach: Echocardiogram shows diastolic dysfunction with normal ejection fraction.  DISCHARGE CONDITIONS AND DIET:   Stable  Dysphagia 2 diet nectar thick  CONSULTS OBTAINED:    DRUG ALLERGIES:  No Known Allergies  DISCHARGE MEDICATIONS:   Current Discharge Medication List    START taking these medications   Details  amoxicillin-clavulanate (AUGMENTIN) 875-125 MG tablet Take 1 tablet by mouth 2 (two) times daily. Qty: 12 tablet, Refills: 0      CONTINUE these medications which have NOT CHANGED   Details  acetaminophen (TYLENOL) 325 MG tablet Take 650 mg by mouth every 6 (six) hours as needed for mild pain.    acetaminophen (TYLENOL) 650 MG suppository Place 650 mg  rectally every 4 (four) hours as needed for fever (general discomfort).    albuterol (PROVENTIL HFA;VENTOLIN HFA) 108 (90 Base) MCG/ACT inhaler Inhale 2 puffs into the lungs every 4 (four) hours as needed for wheezing or shortness of breath.    donepezil (ARICEPT) 10 MG tablet Take 10 mg by mouth at bedtime.    fluticasone furoate-vilanterol (BREO ELLIPTA) 100-25 MCG/INH AEPB Inhale 1 puff into the lungs daily.    ipratropium-albuterol (DUONEB) 0.5-2.5 (3) MG/3ML SOLN Take 3 mLs by nebulization 4 (four) times daily.    levETIRAcetam (KEPPRA) 500 MG tablet Take 500 mg by mouth 2 (two) times daily.    levothyroxine (SYNTHROID, LEVOTHROID) 25 MCG tablet Take 25 mcg by mouth daily before breakfast.    nitroGLYCERIN (NITROSTAT) 0.4 MG SL tablet Place 0.4 mg under the tongue every 5 (five) minutes as needed for chest pain.    omeprazole (PRILOSEC OTC) 20 MG tablet Take 20 mg by mouth daily.    risperiDONE (RISPERDAL) 0.5 MG tablet Take 0.5 mg by mouth every evening.    tiotropium (SPIRIVA) 18 MCG inhalation capsule Place 18 mcg into inhaler and inhale daily.      STOP taking these medications     cefTRIAXone (ROCEPHIN) 1 g injection           Today   CHIEF COMPLAINT:   No acute issues overnight. No runs of V. tach reported this morning.   VITAL SIGNS:  Blood pressure (!) 122/59, pulse (!) 58, temperature 98.6 F (37 C), temperature source Oral, resp. rate 17, height  (1.702 m), weight 62 kg (136 lb 9.6  oz), SpO2 94 %.   REVIEW OF SYSTEMS:  ROS   PHYSICAL EXAMINATION:  GENERAL:  71 y.o.-year-old patient lying in the bed with no acute distress.  NECK:  Supple, no jugular venous distention. No thyroid enlargement, no tenderness.  LUNGS: Minimal bilateral rhonchi , no wheezing, rales,rhonchi  No use of accessory muscles of respiration.  CARDIOVASCULAR: S1, S2 normal. No murmurs, rubs, or gallops.  ABDOMEN: Soft, non-tender, non-distended. Bowel sounds present. No  organomegaly or mass.  EXTREMITIES: No pedal edema, cyanosis, or clubbing.  PSYCHIATRIC: The patient is alert and oriented x Lasix not time or date  SKIN: No obvious rash, lesion, or ulcer.   DATA REVIEW:   CBC  Recent Labs Lab 01/05/17 0515  WBC 10.4  HGB 10.0*  HCT 30.1*  PLT 181    Chemistries   Recent Labs Lab 01/04/17 0245  01/06/17 1307  01/07/17 0658  NA 150*  < > 151*  < > 146*  K 4.3  < >  --   --  3.6  CL 112*  < >  --   --  110  CO2 34*  < >  --   --  33*  GLUCOSE 126*  < >  --   --  111*  BUN 61*  < >  --   --  19  CREATININE 2.09*  < >  --   --  1.09  CALCIUM 8.3*  < >  --   --  8.8*  MG  --   --  2.1  --   --   AST 15  --   --   --   --   ALT 13*  --   --   --   --   ALKPHOS 57  --   --   --   --   BILITOT 0.6  --   --   --   --   < > = values in this interval not displayed.  Cardiac Enzymes No results for input(s): TROPONINI in the last 168 hours.  Microbiology Results  @  RADIOLOGY:  No results found.    Current Discharge Medication List    START taking these medications   Details  amoxicillin-clavulanate (AUGMENTIN) 875-125 MG tablet Take 1 tablet by mouth 2 (two) times daily. Qty: 12 tablet, Refills: 0      CONTINUE these medications which have NOT CHANGED   Details  acetaminophen (TYLENOL) 325 MG tablet Take 650 mg by mouth every 6 (six) hours as needed for mild pain.    acetaminophen (TYLENOL) 650 MG suppository Place 650 mg rectally every 4 (four) hours as needed for fever (general discomfort).    albuterol (PROVENTIL HFA;VENTOLIN HFA) 108 (90 Base) MCG/ACT inhaler Inhale 2 puffs into the lungs every 4 (four) hours as needed for wheezing or shortness of breath.    donepezil (ARICEPT) 10 MG tablet Take 10 mg by mouth at bedtime.    fluticasone furoate-vilanterol (BREO ELLIPTA) 100-25 MCG/INH AEPB Inhale 1 puff into the lungs daily.    ipratropium-albuterol (DUONEB) 0.5-2.5 (3) MG/3ML SOLN Take 3 mLs by nebulization  4 (four) times daily.    levETIRAcetam (KEPPRA) 500 MG tablet Take 500 mg by mouth 2 (two) times daily.    levothyroxine (SYNTHROID, LEVOTHROID) 25 MCG tablet Take 25 mcg by mouth daily before breakfast.    nitroGLYCERIN (NITROSTAT) 0.4 MG SL tablet Place 0.4 mg under the tongue every 5 (five) minutes as needed for chest pain.  omeprazole (PRILOSEC OTC) 20 MG tablet Take 20 mg by mouth daily.    risperiDONE (RISPERDAL) 0.5 MG tablet Take 0.5 mg by mouth every evening.    tiotropium (SPIRIVA) 18 MCG inhalation capsule Place 18 mcg into inhaler and inhale daily.      STOP taking these medications     cefTRIAXone (ROCEPHIN) 1 g injection           Stable for discharge SNF  Patient should follow up with pcp at facility  CODE STATUS:     Code Status Orders        Start     Ordered   01/04/17 0734  Full code  Continuous     01/04/17 0733    Code Status History    Date Active Date Inactive Code Status Order ID Comments User Context   This patient has a current code status but no historical code status.      TOTAL TIME TAKING CARE OF THIS PATIENT: 37 minutes.    Note: This dictation was prepared with Dragon dictation along with smaller phrase technology. Any transcriptional errors that result from this process are unintentional.  Zakhai Meisinger M.D on 01/07/2017 at 9:20 AM  Between 7am to 6pm - Pager - [phone] After 6pm go to [email or web address] - Social research officer, government  [facility]  Office  [phone]  CC: Primary care physician; No PCP Per Patient

## 2017-01-09 LAB — CULTURE, BLOOD (ROUTINE X 2)
CULTURE: NO GROWTH
Culture: NO GROWTH
Special Requests: ADEQUATE
Special Requests: ADEQUATE

## 2017-11-10 DEATH — deceased

## 2017-11-28 ENCOUNTER — Telehealth: Payer: Self-pay

## 2017-11-28 NOTE — Telephone Encounter (Signed)
Recieved Death Certificate from ____[facility] [employer] [facility]____ Placed _______in nurse box_____   Patient is due for cremation please fax dc and calll when ready for pick up

## 2017-11-28 NOTE — Telephone Encounter (Signed)
[facility] and informed them that the death cert they dropped off for this pt is not correct. They have the wrong birthday listed on the death cert. Informed them to bring back a death cert with correct birthday.
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
